# Patient Record
Sex: Male | Born: 1945
Health system: Southern US, Community
[De-identification: ages and names within clinical notes are randomized; demographics above are authoritative.]

## PROBLEM LIST (undated history)

## (undated) DIAGNOSIS — I10 Essential (primary) hypertension: Secondary | ICD-10-CM

## (undated) DIAGNOSIS — E785 Hyperlipidemia, unspecified: Secondary | ICD-10-CM

## (undated) HISTORY — DX: Hyperlipidemia, unspecified: E78.5

## (undated) HISTORY — DX: Essential (primary) hypertension: I10

## (undated) HISTORY — PX: COLONOSCOPY: SHX174

---

## 2016-05-22 DIAGNOSIS — J018 Other acute sinusitis: Secondary | ICD-10-CM | POA: Diagnosis not present

## 2016-06-22 DIAGNOSIS — I10 Essential (primary) hypertension: Secondary | ICD-10-CM | POA: Diagnosis not present

## 2016-06-22 DIAGNOSIS — E785 Hyperlipidemia, unspecified: Secondary | ICD-10-CM | POA: Diagnosis not present

## 2016-06-28 DIAGNOSIS — E875 Hyperkalemia: Secondary | ICD-10-CM | POA: Diagnosis not present

## 2017-03-12 DIAGNOSIS — Z23 Encounter for immunization: Secondary | ICD-10-CM | POA: Diagnosis not present

## 2017-03-15 DIAGNOSIS — E785 Hyperlipidemia, unspecified: Secondary | ICD-10-CM | POA: Diagnosis not present

## 2017-03-15 DIAGNOSIS — Z125 Encounter for screening for malignant neoplasm of prostate: Secondary | ICD-10-CM | POA: Diagnosis not present

## 2017-03-15 DIAGNOSIS — I10 Essential (primary) hypertension: Secondary | ICD-10-CM | POA: Diagnosis not present

## 2017-04-01 DIAGNOSIS — R011 Cardiac murmur, unspecified: Secondary | ICD-10-CM | POA: Diagnosis not present

## 2017-04-01 DIAGNOSIS — Z23 Encounter for immunization: Secondary | ICD-10-CM | POA: Diagnosis not present

## 2017-04-01 DIAGNOSIS — E785 Hyperlipidemia, unspecified: Secondary | ICD-10-CM | POA: Diagnosis not present

## 2017-04-01 DIAGNOSIS — I10 Essential (primary) hypertension: Secondary | ICD-10-CM | POA: Diagnosis not present

## 2017-04-01 DIAGNOSIS — J209 Acute bronchitis, unspecified: Secondary | ICD-10-CM | POA: Diagnosis not present

## 2017-04-18 DIAGNOSIS — R011 Cardiac murmur, unspecified: Secondary | ICD-10-CM | POA: Diagnosis not present

## 2017-04-29 DIAGNOSIS — R062 Wheezing: Secondary | ICD-10-CM | POA: Diagnosis not present

## 2017-04-29 DIAGNOSIS — J3089 Other allergic rhinitis: Secondary | ICD-10-CM | POA: Diagnosis not present

## 2017-04-29 DIAGNOSIS — R05 Cough: Secondary | ICD-10-CM | POA: Diagnosis not present

## 2017-05-19 DIAGNOSIS — R062 Wheezing: Secondary | ICD-10-CM | POA: Diagnosis not present

## 2017-05-19 DIAGNOSIS — J209 Acute bronchitis, unspecified: Secondary | ICD-10-CM | POA: Diagnosis not present

## 2017-05-19 DIAGNOSIS — R05 Cough: Secondary | ICD-10-CM | POA: Diagnosis not present

## 2017-08-09 DIAGNOSIS — R062 Wheezing: Secondary | ICD-10-CM | POA: Diagnosis not present

## 2017-10-07 DIAGNOSIS — M791 Myalgia, unspecified site: Secondary | ICD-10-CM | POA: Diagnosis not present

## 2017-10-07 DIAGNOSIS — J209 Acute bronchitis, unspecified: Secondary | ICD-10-CM | POA: Diagnosis not present

## 2017-10-08 DIAGNOSIS — R252 Cramp and spasm: Secondary | ICD-10-CM | POA: Diagnosis not present

## 2017-10-08 DIAGNOSIS — M791 Myalgia, unspecified site: Secondary | ICD-10-CM | POA: Diagnosis not present

## 2017-10-08 DIAGNOSIS — D539 Nutritional anemia, unspecified: Secondary | ICD-10-CM | POA: Diagnosis not present

## 2017-10-08 DIAGNOSIS — R05 Cough: Secondary | ICD-10-CM | POA: Diagnosis not present

## 2017-10-15 DIAGNOSIS — M791 Myalgia, unspecified site: Secondary | ICD-10-CM | POA: Diagnosis not present

## 2017-10-15 DIAGNOSIS — E785 Hyperlipidemia, unspecified: Secondary | ICD-10-CM | POA: Diagnosis not present

## 2017-10-15 DIAGNOSIS — E875 Hyperkalemia: Secondary | ICD-10-CM | POA: Diagnosis not present

## 2017-10-15 DIAGNOSIS — I10 Essential (primary) hypertension: Secondary | ICD-10-CM | POA: Diagnosis not present

## 2017-10-15 DIAGNOSIS — D539 Nutritional anemia, unspecified: Secondary | ICD-10-CM | POA: Diagnosis not present

## 2017-11-18 DIAGNOSIS — I10 Essential (primary) hypertension: Secondary | ICD-10-CM | POA: Diagnosis not present

## 2017-11-18 DIAGNOSIS — M791 Myalgia, unspecified site: Secondary | ICD-10-CM | POA: Diagnosis not present

## 2017-11-18 DIAGNOSIS — R739 Hyperglycemia, unspecified: Secondary | ICD-10-CM | POA: Diagnosis not present

## 2017-12-03 DIAGNOSIS — K621 Rectal polyp: Secondary | ICD-10-CM | POA: Diagnosis not present

## 2017-12-03 DIAGNOSIS — K635 Polyp of colon: Secondary | ICD-10-CM | POA: Diagnosis not present

## 2017-12-03 DIAGNOSIS — Z86010 Personal history of colonic polyps: Secondary | ICD-10-CM | POA: Diagnosis not present

## 2017-12-03 DIAGNOSIS — D123 Benign neoplasm of transverse colon: Secondary | ICD-10-CM | POA: Diagnosis not present

## 2017-12-03 DIAGNOSIS — D120 Benign neoplasm of cecum: Secondary | ICD-10-CM | POA: Diagnosis not present

## 2017-12-05 DIAGNOSIS — Z1159 Encounter for screening for other viral diseases: Secondary | ICD-10-CM | POA: Diagnosis not present

## 2017-12-05 DIAGNOSIS — Z125 Encounter for screening for malignant neoplasm of prostate: Secondary | ICD-10-CM | POA: Diagnosis not present

## 2017-12-05 DIAGNOSIS — Z0000 Encounter for general adult medical examination without abnormal findings: Secondary | ICD-10-CM | POA: Diagnosis not present

## 2017-12-23 DIAGNOSIS — L509 Urticaria, unspecified: Secondary | ICD-10-CM | POA: Diagnosis not present

## 2018-01-27 DIAGNOSIS — Z23 Encounter for immunization: Secondary | ICD-10-CM | POA: Diagnosis not present

## 2018-02-20 DIAGNOSIS — M353 Polymyalgia rheumatica: Secondary | ICD-10-CM | POA: Diagnosis not present

## 2018-02-20 DIAGNOSIS — E7849 Other hyperlipidemia: Secondary | ICD-10-CM | POA: Diagnosis not present

## 2018-02-20 DIAGNOSIS — Z1389 Encounter for screening for other disorder: Secondary | ICD-10-CM | POA: Diagnosis not present

## 2018-02-20 DIAGNOSIS — R634 Abnormal weight loss: Secondary | ICD-10-CM | POA: Diagnosis not present

## 2018-02-20 DIAGNOSIS — Z125 Encounter for screening for malignant neoplasm of prostate: Secondary | ICD-10-CM | POA: Diagnosis not present

## 2018-02-20 DIAGNOSIS — Z6825 Body mass index (BMI) 25.0-25.9, adult: Secondary | ICD-10-CM | POA: Diagnosis not present

## 2018-02-20 DIAGNOSIS — R82998 Other abnormal findings in urine: Secondary | ICD-10-CM | POA: Diagnosis not present

## 2018-02-20 DIAGNOSIS — F17201 Nicotine dependence, unspecified, in remission: Secondary | ICD-10-CM | POA: Diagnosis not present

## 2018-02-20 DIAGNOSIS — M6281 Muscle weakness (generalized): Secondary | ICD-10-CM | POA: Diagnosis not present

## 2018-02-20 DIAGNOSIS — R011 Cardiac murmur, unspecified: Secondary | ICD-10-CM | POA: Diagnosis not present

## 2018-02-20 DIAGNOSIS — I10 Essential (primary) hypertension: Secondary | ICD-10-CM | POA: Diagnosis not present

## 2018-02-20 DIAGNOSIS — D126 Benign neoplasm of colon, unspecified: Secondary | ICD-10-CM | POA: Diagnosis not present

## 2018-02-21 DIAGNOSIS — E8809 Other disorders of plasma-protein metabolism, not elsewhere classified: Secondary | ICD-10-CM | POA: Diagnosis not present

## 2018-02-24 ENCOUNTER — Other Ambulatory Visit: Payer: Self-pay | Admitting: Internal Medicine

## 2018-02-24 DIAGNOSIS — F17201 Nicotine dependence, unspecified, in remission: Secondary | ICD-10-CM

## 2018-02-26 ENCOUNTER — Inpatient Hospital Stay
Admission: RE | Admit: 2018-02-26 | Discharge: 2018-02-26 | Disposition: A | Discharge status: Home / Self Care | Payer: Self-pay | Source: Ambulatory Visit | Attending: Internal Medicine | Admitting: Internal Medicine

## 2018-03-03 ENCOUNTER — Ambulatory Visit
Admission: RE | Admit: 2018-03-03 | Discharge: 2018-03-03 | Disposition: A | Discharge status: Home / Self Care | Payer: Medicare Other | Source: Ambulatory Visit | Attending: Internal Medicine | Admitting: Internal Medicine

## 2018-03-03 ENCOUNTER — Ambulatory Visit: Payer: Self-pay

## 2018-03-03 DIAGNOSIS — F17201 Nicotine dependence, unspecified, in remission: Secondary | ICD-10-CM

## 2018-03-03 DIAGNOSIS — Z87891 Personal history of nicotine dependence: Secondary | ICD-10-CM | POA: Diagnosis not present

## 2018-03-06 ENCOUNTER — Other Ambulatory Visit: Payer: Self-pay | Admitting: Internal Medicine

## 2018-03-06 DIAGNOSIS — R911 Solitary pulmonary nodule: Secondary | ICD-10-CM

## 2018-03-12 DIAGNOSIS — Z6825 Body mass index (BMI) 25.0-25.9, adult: Secondary | ICD-10-CM | POA: Diagnosis not present

## 2018-03-12 DIAGNOSIS — M791 Myalgia, unspecified site: Secondary | ICD-10-CM | POA: Diagnosis not present

## 2018-03-12 DIAGNOSIS — E663 Overweight: Secondary | ICD-10-CM | POA: Diagnosis not present

## 2018-03-13 DIAGNOSIS — R634 Abnormal weight loss: Secondary | ICD-10-CM | POA: Diagnosis not present

## 2018-03-27 DIAGNOSIS — E7849 Other hyperlipidemia: Secondary | ICD-10-CM | POA: Diagnosis not present

## 2018-03-27 DIAGNOSIS — R918 Other nonspecific abnormal finding of lung field: Secondary | ICD-10-CM | POA: Diagnosis not present

## 2018-03-27 DIAGNOSIS — I77819 Aortic ectasia, unspecified site: Secondary | ICD-10-CM | POA: Diagnosis not present

## 2018-03-27 DIAGNOSIS — M791 Myalgia, unspecified site: Secondary | ICD-10-CM | POA: Diagnosis not present

## 2018-03-27 DIAGNOSIS — I350 Nonrheumatic aortic (valve) stenosis: Secondary | ICD-10-CM | POA: Diagnosis not present

## 2018-03-27 DIAGNOSIS — Z6825 Body mass index (BMI) 25.0-25.9, adult: Secondary | ICD-10-CM | POA: Diagnosis not present

## 2018-03-27 DIAGNOSIS — I2510 Atherosclerotic heart disease of native coronary artery without angina pectoris: Secondary | ICD-10-CM | POA: Diagnosis not present

## 2018-03-27 DIAGNOSIS — E291 Testicular hypofunction: Secondary | ICD-10-CM | POA: Diagnosis not present

## 2018-03-27 DIAGNOSIS — I10 Essential (primary) hypertension: Secondary | ICD-10-CM | POA: Diagnosis not present

## 2018-03-27 DIAGNOSIS — J449 Chronic obstructive pulmonary disease, unspecified: Secondary | ICD-10-CM | POA: Diagnosis not present

## 2018-04-11 ENCOUNTER — Encounter: Payer: Self-pay | Admitting: Internal Medicine

## 2018-04-11 ENCOUNTER — Ambulatory Visit (INDEPENDENT_AMBULATORY_CARE_PROVIDER_SITE_OTHER): Payer: Medicare Other | Admitting: Internal Medicine

## 2018-04-11 VITALS — BP 126/72 | HR 74 | Ht 68.0 in | Wt 163.2 lb

## 2018-04-11 DIAGNOSIS — E782 Mixed hyperlipidemia: Secondary | ICD-10-CM | POA: Diagnosis not present

## 2018-04-11 DIAGNOSIS — I350 Nonrheumatic aortic (valve) stenosis: Secondary | ICD-10-CM | POA: Diagnosis not present

## 2018-04-11 DIAGNOSIS — Z789 Other specified health status: Secondary | ICD-10-CM

## 2018-04-11 DIAGNOSIS — I2510 Atherosclerotic heart disease of native coronary artery without angina pectoris: Secondary | ICD-10-CM | POA: Diagnosis not present

## 2018-04-11 NOTE — Progress Notes (Addendum)
LIPID CLINIC CONSULT NOTE  Chief Complaint:  Evaluate cardiac risk  Primary Care Physician: Crist Infante, MD  HPI:  Aaron Miller is a 72 y.o. male who is being seen today for the evaluation of CAC and dyslipidemia at the request of Crist Infante, MD.  This is a pleasant 72 year old male who was kindly referred to me by Dr. Haynes Kerns for evaluation of dyslipidemia, statin intolerance and multivessel coronary artery calcification.  Mr.Chandran recently moved to [place] but have been living in [place], previously in [place] as well as in [place] for which she worked for the [employer] of [employer].  Initially went to school in [place] at the [employer].  He has a past medical history significant for hypertension and dyslipidemia as well as smoking.  He underwent a screening CT scan for possible lung malignancy and was found to have a subcentimeter nodule.  This will require follow-up in February.  In addition he was noted to have multivessel coronary artery calcification, as well as an ectatic and dilated ascending aorta to 4.2 cm.  Unfortunately he has been statin intolerant.  Most recently was on rosuvastatin and had significant myalgias.  He was taken off of this and has a marked dyslipidemia.  As of March 27, 2018 total cholesterol is 194, triglycerides 228, HDL 47 LDL 101.  He was started on Repatha per his primary care provider and has had one injection.  He was referred I presume for cardiovascular evaluation given his findings of multivessel coronary artery calcification and mild aortic stenosis.  Mr. Beckers reports he is quite physically active.  He says he is asymptomatic with exercise and activity.  He denies any chest pain or shortness of breath.  He said he did have stress testing in February this year in [place] that was ordered by his primary care provider which was apparently negative.  PMHx:  Past Medical History:  Diagnosis Date  .  Hyperlipidemia   . Hypertension     History reviewed. No pertinent surgical history.  FAMHx:  Family History  Problem Relation Age of Onset  . Cancer Mother     SOCHx:   reports that he has quit smoking. He has never used smokeless tobacco. His alcohol and drug histories are not on file.  ALLERGIES:  Allergies  Allergen Reactions  . Statins Other (See Comments)    Myalgias - Crestor, pravastatin    ROS: Pertinent items noted in HPI and remainder of comprehensive ROS otherwise negative.  HOME MEDS: Current Outpatient Medications on File Prior to Visit  Medication Sig Dispense Refill  . amLODipine (NORVASC) 10 MG tablet     . aspirin 81 MG tablet Take 81 mg by mouth daily.    . Evolocumab (REPATHA SURECLICK) 381 MG/ML SOAJ Inject 1 Dose into the skin every 14 (fourteen) days.     No current facility-administered medications on file prior to visit.     LABS/IMAGING: No results found for this or any previous visit (from the past 48 hour(s)). No results found.  LIPID PANEL: No results found for: CHOL, TRIG, HDL, CHOLHDL, VLDL, LDLCALC, LDLDIRECT  WEIGHTS: Wt Readings from Last 3 Encounters:  04/11/18 163 lb 3.2 oz (74 kg)    VITALS: BP 126/72   Pulse 74   Ht 5\' 8"  (1.727 m)   Wt 163 lb 3.2 oz (74 kg)   BMI 24.81 kg/m   EXAM: General appearance: alert and no distress Neck: no carotid bruit, no JVD and  thyroid not enlarged, symmetric, no tenderness/mass/nodules Lungs: clear to auscultation bilaterally Heart: regular rate and rhythm, S1, S2 normal and systolic murmur: early systolic 2/6, crescendo at 2nd right intercostal space Abdomen: soft, non-tender; bowel sounds normal; no masses,  no organomegaly Extremities: extremities normal, atraumatic, no cyanosis or edema Pulses: 2+ and symmetric Skin: Skin color, texture, turgor normal. No rashes or lesions Neurologic: Grossly normal Psych: Pleasant  EKG: Deferred  ASSESSMENT: 1. Marked  dyslipidemia 2. Statin intolerance 3. Multivessel coronary artery calcification 4. Dilated aorta 5. Mild aortic stenosis  PLAN: 1.   Mr. Mcneill has marked dyslipidemia with goal LDL cholesterol less than 70 given multivessel coronary artery calcification and mild aortic stenosis.  He is on been statin intolerant and had significant myalgias most recently on Crestor.  He has been switched to [place] by his primary care provider and has already had one injection.  He seems to be tolerating it.  We will have to reassess his lipids in a few months to see how well he is responding to this.  He may require additional therapy.  As he is asymptomatic and had stress testing earlier this year which was low risk, he would not need any further assessment of his multivessel calcification at this time.  He will need continued follow-up in cardiology for his calcification and mild aortic stenosis.  Thanks as always for the kind referral.  Pixie Casino, MD, FACC, [place] Director of the [facility]  [facility] Diplomate of the American Board of Clinical Lipidology Attending Cardiologist  Direct Dial: [phone]  Fax: [phone]  Website:  [email or web address] [email or web address] Akiera Allbaugh 04/11/2018, 1:07 PM

## 2018-04-11 NOTE — Patient Instructions (Signed)
Medication Instructions:  Continue current medications If you need a refill on your cardiac medications before your next appointment, please call your pharmacy.   Lab work: NONE If you have labs (blood work) drawn today and your tests are completely normal, you will receive your results only by: Marland Kitchen MyChart Message (if you have MyChart) OR . A paper copy in the mail If you have any lab test that is abnormal or we need to change your treatment, we will call you to review the results.  Testing/Procedures: NONE  Follow-Up: At [facility], you and your health needs are our priority.  As part of our continuing mission to provide you with exceptional heart care, we have created designated Provider Care Teams.  These Care Teams include your primary Cardiologist (physician) and Advanced Practice Providers (APPs -  Physician Assistants and Nurse Practitioners) who all work together to provide you with the care you need, when you need it. You will need a follow up appointment in 6 months.  Please call our office 2 months in advance to schedule this appointment.  You may see Dr. Debara Pickett or one of the following Advanced Practice Providers on your designated Care Team: Almyra Deforest, [place] . Fabian Sharp, PA-C  Any Other Special Instructions Will Be Listed Below (If Applicable).

## 2018-04-23 ENCOUNTER — Telehealth: Payer: Self-pay | Admitting: Internal Medicine

## 2018-04-23 NOTE — Telephone Encounter (Signed)
Received notice from [facility] that patient had been receiving ONLY samples of Repatha from Dr. Silvestre Mesi office. He has not gotten a prescription as both [place] were denied with submitted for PA by PCP.   Patient needs PA for medication & possible patient assistance.   LM for patient to return call - need prescription drug info in order to process prior authorization

## 2018-04-29 NOTE — Telephone Encounter (Addendum)
Spoke with patient about Repatha. He reports he has only been receiving samples from Dr. Joylene Draft. Explained that I will need to process a prior auth with insurance. He provided the info noted below:  ID: [id number] BIN: [id number] PCN: [id number] RxGrp: [id number]  Explained I will notify him of outcome of PA

## 2018-04-30 DIAGNOSIS — M791 Myalgia, unspecified site: Secondary | ICD-10-CM | POA: Diagnosis not present

## 2018-04-30 DIAGNOSIS — G6289 Other specified polyneuropathies: Secondary | ICD-10-CM | POA: Diagnosis not present

## 2018-04-30 DIAGNOSIS — Z6824 Body mass index (BMI) 24.0-24.9, adult: Secondary | ICD-10-CM | POA: Diagnosis not present

## 2018-04-30 NOTE — Telephone Encounter (Signed)
Appeals letter sent to MD to review.

## 2018-04-30 NOTE — Telephone Encounter (Signed)
Attempted PA for Repatha [employer] via [email or web address]. Copied below is notice received:  PA was already submitted for this patient and drug which was denied. CaseId: [id number] Status: Denied Appeal Information:  Attention: [place]  [id number], [place], [place], [address]  Phone: [phone]  Fax: [phone]

## 2018-05-02 DIAGNOSIS — Z6824 Body mass index (BMI) 24.0-24.9, adult: Secondary | ICD-10-CM | POA: Diagnosis not present

## 2018-05-02 DIAGNOSIS — M791 Myalgia, unspecified site: Secondary | ICD-10-CM | POA: Diagnosis not present

## 2018-05-05 ENCOUNTER — Ambulatory Visit (INDEPENDENT_AMBULATORY_CARE_PROVIDER_SITE_OTHER): Payer: Medicare Other | Admitting: Neurology

## 2018-05-05 ENCOUNTER — Encounter: Payer: Self-pay | Admitting: Neurology

## 2018-05-05 VITALS — BP 140/60 | HR 90 | Ht 68.0 in | Wt 161.0 lb

## 2018-05-05 DIAGNOSIS — R202 Paresthesia of skin: Secondary | ICD-10-CM | POA: Diagnosis not present

## 2018-05-05 DIAGNOSIS — M791 Myalgia, unspecified site: Secondary | ICD-10-CM | POA: Diagnosis not present

## 2018-05-05 DIAGNOSIS — I2510 Atherosclerotic heart disease of native coronary artery without angina pectoris: Secondary | ICD-10-CM

## 2018-05-05 DIAGNOSIS — R292 Abnormal reflex: Secondary | ICD-10-CM | POA: Diagnosis not present

## 2018-05-05 NOTE — Progress Notes (Signed)
[facility] Clinic Note - Initial Visit   Date: 05/05/18  Aaron Miller MRN: [id number] DOB: 09/18/1945   Dear Dr. Joylene Draft:  Thank you for your kind referral of Aaron Miller for consultation of myalgias. Although his history is well known to you, please allow [place] to reiterate it for the purpose of our medical record. The patient was accompanied to the clinic by self.   History of Present Illness: Aaron Miller is a 72 y.o. right-handed Caucasian male with hypertension and hyperlipidemia presenting for evaluation of myalgias.  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    Starting around August, he began having soreness over the shins, thighs, and shoulder region.  Soreness is described as an achy pain and tenderness over the muscles.  He wakes up around 2-3am with throbbing and burning pain in the legs which is alleviated with walking.  He gets cramps in the legs, denies low back pain. Pain is relieved with NSAIDs/tylenol. He does not have numbness. Initially, there was concern with he has statin-induced myalgias and was taken off Crestor which may have helped some.  He has been off this for 3 months.  He does not have weakness of the arms/legs, denies problems with walking/climbing stairs. No falls.  CK is normal.  He was evaluated by rheumatology for these same symptoms whose evaluation was not typical for PMR and would consider trial of steroids, if his neurological evaluation returned negative.   Out-side paper records, electronic medical record, and images have been reviewed where available and summarized as:  Labs 02/20/2018: ESR 15, vitamin D 30, TSH 1.32, CK 34, CRP 31*, SPEP with IFE polyclonal gammopathy, hepatitis panel neg  Past Medical History:  Diagnosis Date  . Hyperlipidemia   . Hypertension     History reviewed. No pertinent surgical  history.   Medications:  Outpatient Encounter Medications as of 05/05/2018  Medication Sig  . amLODipine (NORVASC) 10 MG tablet   . aspirin 81 MG tablet Take 81 mg by mouth daily.  . Evolocumab (REPATHA SURECLICK) 103 MG/ML SOAJ Inject 1 Dose into the skin every 14 (fourteen) days.   No facility-administered encounter medications on file as of 05/05/2018.      Allergies:  Allergies  Allergen Reactions  . Statins Other (See Comments)    Myalgias - Crestor, pravastatin    Family History: Family History  Problem Relation Age of Onset  . Lung cancer Mother   . Hypertension Father   . Diabetes Father   . Lung cancer Maternal Grandmother     Social History: Social History   Tobacco Use  . Smoking status: Former Research scientist (life sciences)  . Smokeless tobacco: Never Used  Substance Use Topics  . Alcohol use: Not on file    Comment: 2 glasses of wine daily  . Drug use: Never   Social History   Social History Narrative   Married.  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  Masters degree    Review of Systems:  CONSTITUTIONAL: No fevers, chills, night sweats, or weight loss.   EYES: No visual changes or eye pain ENT: No hearing changes.  No history of nose bleeds.   RESPIRATORY: No cough, wheezing and shortness of breath.   CARDIOVASCULAR: Negative for chest pain, and palpitations.   GI: Negative for abdominal discomfort, blood in stools or black stools.  No recent change in bowel habits.   GU:  No history of incontinence.   MUSCLOSKELETAL: +history of joint pain or swelling.  +myalgias.   SKIN: Negative for lesions, rash, and itching.   HEMATOLOGY/ONCOLOGY: Negative for prolonged bleeding, bruising easily, and swollen nodes.  No history of cancer.   ENDOCRINE: Negative for cold or heat intolerance, polydipsia or goiter.   PSYCH:  No depression or anxiety symptoms.   NEURO: As Above.   Vital Signs:  BP 140/60   Pulse 90   Ht '5\' 8"'  (1.727 m)   Wt 161  lb (73 kg)   SpO2 96%   BMI 24.48 kg/m    General Medical Exam:   General:  Well appearing, comfortable.   Eyes/ENT: see cranial nerve examination.   Neck: No masses appreciated.  Full range of motion without tenderness.  No carotid bruits. Respiratory:  Clear to auscultation, good air entry bilaterally.   Cardiac:  Regular rate and rhythm, no murmur.   Extremities:  No deformities, edema, or skin discoloration.  Skin:  Dry leathery skin over the lower legs bilaterally.   Neurological Exam: MENTAL STATUS including orientation to time, place, person, recent and remote memory, attention span and concentration, language, and fund of knowledge is normal.  Speech is not dysarthric.  CRANIAL NERVES: II:  No visual field defects.  Unremarkable fundi.   III-IV-VI: Pupils equal round and reactive to light.  Normal conjugate, extra-ocular eye movements in all directions of gaze.  No nystagmus.  No ptosis.   V:  Normal facial sensation.    VII:  Normal facial symmetry and movements.   VIII:  Normal hearing and vestibular function.   IX-X:  Normal palatal movement.   XI:  Normal shoulder shrug and head rotation.   XII:  Normal tongue strength and range of motion, no deviation or fasciculation.  MOTOR:  No atrophy, fasciculations or abnormal movements.  No pronator drift.  Tone is normal.    Right Upper Extremity:    Left Upper Extremity:    Deltoid  5/5   Deltoid  5/5   Biceps  5/5   Biceps  5/5   Triceps  5/5   Triceps  5/5   Wrist extensors  5/5   Wrist extensors  5/5   Wrist flexors  5/5   Wrist flexors  5/5   Finger extensors  5/5   Finger extensors  5/5   Finger flexors  5/5   Finger flexors  5/5   Dorsal interossei  5/5   Dorsal interossei  5/5   Abductor pollicis  5/5   Abductor pollicis  5/5   Tone (Ashworth scale)  0  Tone (Ashworth scale)  0   Right Lower Extremity:    Left Lower Extremity:    Hip flexors  5/5   Hip flexors  5/5   Hip extensors  5/5   Hip extensors  5/5    Knee flexors  5/5   Knee flexors  5/5   Knee extensors  5/5   Knee extensors  5/5   Dorsiflexors  5/5   Dorsiflexors  5/5   Plantarflexors  5/5   Plantarflexors  5/5   Toe extensors  5/5   Toe extensors  5/5   Toe flexors  5/5   Toe flexors  5/5   Tone (Ashworth scale)  0  Tone (Ashworth scale)  0   MSRs:  Right  Left brachioradialis 2+  brachioradialis 2+  biceps 2+  biceps 2+  triceps 2+  triceps 2+  patellar 3+  patellar 3+  ankle jerk 1+  ankle jerk 1+  Hoffman no  Hoffman no  plantar response up  plantar response up   SENSORY:  Normal and symmetric perception of light touch, pinprick, vibration, and proprioception.  Romberg's sign absent.   COORDINATION/GAIT: Normal finger-to- nose-finger and heel-to-shin.  Intact rapid alternating movements bilaterally.  Able to rise from a chair without using arms.  Gait narrow based and stable. Tandem and stressed gait intact.    IMPRESSION: 1.  Diffuse polymyalgias of the arms and legs.  His neurological exam does not disclose weakness and with normal CK, myopathy is less likely.Statin-induced myalgias is possible, but I would expect symptoms to improve now that he has been off this for 3 months.  He will undergo NCS/EMG of the right arm and leg which will look for neuropathy and myopathy.   2.  Possible lumbar canal stenosis given exam showing brisk patella jerks and extensor plantar responses.  He does not have low back pain or symptoms of neurogenic claudication.  I will order MRI lumbar spine to evaluate for structural pathology.  Further recommendations pending results   Thank you for allowing me to participate in patient's care.  If I can answer any additional questions, I would be pleased to do so.    Sincerely,     K. Posey Pronto, DO

## 2018-05-05 NOTE — Patient Instructions (Addendum)
NCS/EMG of the right arm and leg  MRI lumbar spine without contrast   ELECTROMYOGRAM AND NERVE CONDUCTION STUDIES (EMG/NCS) INSTRUCTIONS  How to Prepare The neurologist conducting the EMG will need to know if you have certain medical conditions. Tell the neurologist and other EMG lab personnel if you: . Have a pacemaker or any other electrical medical device . Take blood-thinning medications . Have hemophilia, a blood-clotting disorder that causes prolonged bleeding Bathing Take a shower or bath shortly before your exam in order to remove oils from your skin. Don't apply lotions or creams before the exam.  What to Expect You'll likely be asked to change into a hospital gown for the procedure and lie down on an examination table. The following explanations can help you understand what will happen during the exam.  . Electrodes. The neurologist or a technician places surface electrodes at various locations on your skin depending on where you're experiencing symptoms. Or the neurologist may insert needle electrodes at different sites depending on your symptoms.  . Sensations. The electrodes will at times transmit a tiny electrical current that you may feel as a twinge or spasm. The needle electrode may cause discomfort or pain that usually ends shortly after the needle is removed. If you are concerned about discomfort or pain, you may want to talk to the neurologist about taking a short break during the exam.  . Instructions. During the needle EMG, the neurologist will assess whether there is any spontaneous electrical activity when the muscle is at rest - activity that isn't present in healthy muscle tissue - and the degree of activity when you slightly contract the muscle.  He or she will give you instructions on resting and contracting a muscle at appropriate times. Depending on what muscles and nerves the neurologist is examining, he or she may ask you to change positions during the exam.   After your EMG You may experience some temporary, minor bruising where the needle electrode was inserted into your muscle. This bruising should fade within several days. If it persists, contact your primary care doctor.

## 2018-05-09 ENCOUNTER — Telehealth: Payer: Self-pay | Admitting: Internal Medicine

## 2018-05-09 NOTE — Telephone Encounter (Signed)
Letter + office note + labs has been faxed.

## 2018-05-09 NOTE — Telephone Encounter (Signed)
Follow up:    Patient calling concerns a appeal for insurance for some medication.

## 2018-05-09 NOTE — Telephone Encounter (Signed)
MD has composed appeals letter. This will be faxed today with MD office note & labs  LM for patient with this info.

## 2018-05-09 NOTE — Telephone Encounter (Signed)
Routed you the edited letter.  Dr. Lemmie Evens

## 2018-05-19 NOTE — Telephone Encounter (Signed)
Spoke with [facility] [employer] and was notified patient has been approved for Repatha from May 21, 2017 - May 13, 2019. They will re-fax the approval notice.

## 2018-05-19 NOTE — Telephone Encounter (Signed)
LMTCB to discuss Repatha approval

## 2018-05-20 NOTE — Telephone Encounter (Signed)
LMTCB to discuss Repatha approval

## 2018-05-26 DIAGNOSIS — I2510 Atherosclerotic heart disease of native coronary artery without angina pectoris: Secondary | ICD-10-CM | POA: Diagnosis not present

## 2018-05-26 DIAGNOSIS — I10 Essential (primary) hypertension: Secondary | ICD-10-CM | POA: Diagnosis not present

## 2018-05-29 ENCOUNTER — Ambulatory Visit (INDEPENDENT_AMBULATORY_CARE_PROVIDER_SITE_OTHER): Payer: Medicare Other | Admitting: Neurology

## 2018-05-29 DIAGNOSIS — M5416 Radiculopathy, lumbar region: Secondary | ICD-10-CM

## 2018-05-29 DIAGNOSIS — M791 Myalgia, unspecified site: Secondary | ICD-10-CM | POA: Diagnosis not present

## 2018-05-29 DIAGNOSIS — R202 Paresthesia of skin: Secondary | ICD-10-CM

## 2018-05-29 NOTE — Procedures (Signed)
[facility]  [place]  [place], [place] [address] Tel: [phone] Fax:  [phone] Test Date:  05/29/2018  Patient: Aaron Miller DOB: 1946-04-02 Physician: Narda Amber, DO  Sex: Male Height: 5\' 8"  Ref Phys: Narda Amber, DO  ID#: [id number] Temp: 35.0C Technician:    Patient Complaints: This is a 73 year old man referred for evaluation of generalized myalgias and polyarthralgias.  NCV & EMG Findings: Extensive electrodiagnostic testing of the right lower extremity and additional studies of the left shows:  1. Bilateral sural and superficial peroneal sensory responses are within normal limits. 2. Bilateral peroneal motor responses are reduced at the extensor digitorum brevis, and normal at the tibialis anterior.  Bilateral tibial motor responses are within normal limits. 3. Bilateral tibial H reflex studies are mildly prolonged. 4. Chronic motor axonal loss changes are seen affecting bilateral L4 myotomes, without accompanied active denervation.  Impression: 1. Chronic L4 radiculopathy affecting bilateral lower extremities, mild-to-moderate in degree electrically. 2. There is no evidence of a diffuse myopathy or sensorimotor polyneuropathy.   ___________________________ Narda Amber, DO    Nerve Conduction Studies Anti Sensory Summary Table   Site NR Peak (ms) Norm Peak (ms) P-T Amp (V) Norm P-T Amp  Left Sup Peroneal Anti Sensory (Ant Lat Mall)  35C  12 cm    3.6 <4.6 5.0 >3  Right Sup Peroneal Anti Sensory (Ant Lat Mall)  35C  12 cm    2.8 <4.6 3.9 >3  Left Sural Anti Sensory (Lat Mall)  35C  Calf    3.6 <4.6 5.7 >3  Right Sural Anti Sensory (Lat Mall)  35C  Calf    2.1 <4.6 6.9 >3   Motor Summary Table   Site NR Onset (ms) Norm Onset (ms) O-P Amp (mV) Norm O-P Amp Site1 Site2 Delta-0 (ms) Dist (cm) Vel (m/s) Norm Vel (m/s)  Left Peroneal Motor (Ext Dig Brev)  35C  Ankle    3.2 <6.0 0.5 >2.5 B Fib Ankle 10.2 35.0 34 >40  B Fib     13.4  0.5  Poplt B Fib 1.5 7.0 47 >40  Poplt    14.9  0.6         Right Peroneal Motor (Ext Dig Brev)  35C  Ankle    3.6 <6.0 1.7 >2.5 B Fib Ankle 8.5 38.0 45 >40  B Fib    12.1  1.7  Poplt B Fib 1.8 8.0 44 >40  Poplt    13.9  1.7         Left Peroneal TA Motor (Tib Ant)  35C  Fib Head    3.3 <4.5 4.1 >3 Poplit Fib Head 1.1 7.0 64 >40  Poplit    4.4  3.8         Right Peroneal TA Motor (Tib Ant)  35C  Fib Head    3.1 <4.5 4.1 >3 Poplit Fib Head 1.5 8.0 53 >40  Poplit    4.6  4.0         Left Tibial Motor (Abd Hall Brev)  35C  Ankle    4.4 <6.0 4.3 >4 Knee Ankle 10.0 42.0 42 >40  Knee    14.4  2.8         Right Tibial Motor (Abd Hall Brev)  35C  Ankle    4.1 <6.0 6.2 >4 Knee Ankle 9.6 40.0 42 >40  Knee    13.7  3.1          H Reflex Studies  NR H-Lat (ms) Lat Norm (ms) L-R H-Lat (ms)  Left Tibial (Gastroc)  35C     38.23 <35 1.09  Right Tibial (Gastroc)  35C     37.14 <35 1.09   EMG   Side Muscle Ins Act Fibs Psw Fasc Number Recrt Dur Dur. Amp Amp. Poly Poly. Comment  Left RectFemoris Nml Nml Nml Nml 1- Rapid Some 1+ Some 1+ Some 1+ N/A  Left AntTibialis Nml Nml Nml Nml 1- Rapid Few 1+ Few 1+ Nml Nml N/A  Left Gastroc Nml Nml Nml Nml Nml Nml Nml Nml Nml Nml Nml Nml N/A  Left Flex Dig Long Nml Nml Nml Nml Nml Nml Nml Nml Nml Nml Nml Nml N/A  Left BicepsFemS Nml Nml Nml Nml Nml Nml Nml Nml Nml Nml Nml Nml N/A  Left GluteusMed Nml Nml Nml Nml Nml Nml Nml Nml Nml Nml Nml Nml N/A  Right AntTibialis Nml Nml Nml Nml 1- Rapid Some 1+ Some 1+ Nml Nml N/A  Right RectFemoris Nml Nml Nml Nml 1- Rapid Many 1+ Many 1+ Nml Nml N/A  Right BicepsFemS Nml Nml Nml Nml Nml Nml Nml Nml Nml Nml Nml Nml N/A  Right Flex Dig Long Nml Nml Nml Nml Nml Nml Nml Nml Nml Nml Nml Nml N/A  Right GluteusMed Nml Nml Nml Nml Nml Nml Nml Nml Nml Nml Nml Nml N/A  Right Gastroc Nml Nml Nml Nml Nml Nml Nml Nml Nml Nml Nml Nml N/A      Waveforms:

## 2018-06-02 ENCOUNTER — Telehealth: Payer: Self-pay | Admitting: *Deleted

## 2018-06-02 NOTE — Telephone Encounter (Signed)
Called and gave patient results.

## 2018-06-02 NOTE — Telephone Encounter (Signed)
-----   Message from Alda Berthold, DO sent at 05/30/2018  5:57 PM EST ----- Please inform patient that his nerve testing does not show evidence of muscle disease or neuropathy.  There is some evidence of nerve impingement stemming from his back.  MRI lumbar spine is scheduled for February.  Once these results are available, we will be in touch with him.

## 2018-06-05 MED ORDER — EVOLOCUMAB 140 MG/ML ~~LOC~~ SOAJ: 1.0000 | SUBCUTANEOUS | 11 refills | Status: DC

## 2018-06-05 NOTE — Addendum Note (Signed)
Addended by: Fidel Levy on: 06/05/2018 09:35 AM   Modules accepted: Orders

## 2018-06-05 NOTE — Telephone Encounter (Signed)
Patient has not returned call to date. Rx sent to [facility] for Repatha

## 2018-06-07 ENCOUNTER — Ambulatory Visit
Admission: RE | Admit: 2018-06-07 | Discharge: 2018-06-07 | Disposition: A | Discharge status: Home / Self Care | Payer: Medicare Other | Source: Ambulatory Visit | Attending: Neurology | Admitting: Neurology

## 2018-06-07 DIAGNOSIS — M5127 Other intervertebral disc displacement, lumbosacral region: Secondary | ICD-10-CM | POA: Diagnosis not present

## 2018-06-07 DIAGNOSIS — R202 Paresthesia of skin: Secondary | ICD-10-CM

## 2018-06-07 DIAGNOSIS — R292 Abnormal reflex: Secondary | ICD-10-CM

## 2018-06-07 DIAGNOSIS — M4807 Spinal stenosis, lumbosacral region: Secondary | ICD-10-CM | POA: Diagnosis not present

## 2018-06-07 DIAGNOSIS — M791 Myalgia, unspecified site: Secondary | ICD-10-CM

## 2018-06-07 DIAGNOSIS — M48061 Spinal stenosis, lumbar region without neurogenic claudication: Secondary | ICD-10-CM | POA: Diagnosis not present

## 2018-06-07 DIAGNOSIS — M5126 Other intervertebral disc displacement, lumbar region: Secondary | ICD-10-CM | POA: Diagnosis not present

## 2018-06-10 ENCOUNTER — Telehealth: Payer: Self-pay | Admitting: *Deleted

## 2018-06-10 NOTE — Telephone Encounter (Signed)
-----   Message from Alda Berthold, DO sent at 06/09/2018 11:54 AM EST ----- Please inform patient that his MRI lumbar spine shows very mild age-related changes and narrowing, but nothing which would explain the severity of his leg pain.  He can follow-up in the office on Wednesday 10:30a or Friday morning at 8:30am to review results and decide the next step.

## 2018-06-10 NOTE — Telephone Encounter (Signed)
I spoke with patient and gave him the results.  He will come in on 06/13/2018 to see Dr. Posey Pronto.

## 2018-06-13 ENCOUNTER — Telehealth: Payer: Self-pay | Admitting: *Deleted

## 2018-06-13 ENCOUNTER — Encounter: Payer: Self-pay | Admitting: Neurology

## 2018-06-13 ENCOUNTER — Other Ambulatory Visit (INDEPENDENT_AMBULATORY_CARE_PROVIDER_SITE_OTHER): Payer: Medicare Other

## 2018-06-13 ENCOUNTER — Ambulatory Visit (INDEPENDENT_AMBULATORY_CARE_PROVIDER_SITE_OTHER): Payer: Medicare Other | Admitting: Neurology

## 2018-06-13 ENCOUNTER — Other Ambulatory Visit: Payer: Self-pay | Admitting: *Deleted

## 2018-06-13 ENCOUNTER — Ambulatory Visit: Payer: Medicare Other | Admitting: Neurology

## 2018-06-13 VITALS — BP 120/74 | HR 79 | Ht 68.0 in | Wt 159.4 lb

## 2018-06-13 DIAGNOSIS — L299 Pruritus, unspecified: Secondary | ICD-10-CM

## 2018-06-13 DIAGNOSIS — D649 Anemia, unspecified: Secondary | ICD-10-CM | POA: Diagnosis not present

## 2018-06-13 DIAGNOSIS — R6889 Other general symptoms and signs: Secondary | ICD-10-CM

## 2018-06-13 DIAGNOSIS — T148XXA Other injury of unspecified body region, initial encounter: Secondary | ICD-10-CM | POA: Diagnosis not present

## 2018-06-13 DIAGNOSIS — G2581 Restless legs syndrome: Secondary | ICD-10-CM

## 2018-06-13 DIAGNOSIS — R5383 Other fatigue: Secondary | ICD-10-CM

## 2018-06-13 LAB — FERRITIN: Ferritin: 154.9 ng/mL (ref 22.0–322.0)

## 2018-06-13 MED ORDER — ROPINIROLE HCL 0.25 MG PO TABS: ORAL_TABLET | ORAL | 3 refills | Status: DC

## 2018-06-13 NOTE — Progress Notes (Signed)
Follow-up Visit   Date: 06/13/18    Aaron Miller MRN: [id number] DOB: June 14, 1945   Interim History: Aaron Miller is a 73 y.o. right-handed Caucasian male with hypertension and hyperlipidemia returning to the clinic for follow-up of myalgias.  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    History of present illness: Starting around August, he began having soreness over the shins, thighs, and shoulder region.  Soreness is described as an achy pain and tenderness over the muscles.  He wakes up around 2-3am with throbbing and burning pain in the legs which is alleviated with walking.  He gets cramps in the legs, denies low back pain. Pain is relieved with NSAIDs/tylenol. He does not have numbness. Initially, there was concern with he has statin-induced myalgias and was taken off Crestor which may have helped some.  He has been off this for 3 months.  He does not have weakness of the arms/legs, denies problems with walking/climbing stairs. No falls.  CK is normal.  He was evaluated by rheumatology for these same symptoms whose evaluation was not typical for PMR and would consider trial of steroids, if his neurological evaluation returned negative.   UPDATE 06/13/2018:  He is here to review the results of his EMG and MRI lumbar spine.  There was no evidence of structural pathology on his MRI to explain his pain, no signs of myopathy or neuropathy on his EMG.  There was a mild L4 radiculopathy bilaterally, however imaging did not show severe enough nerve encroachment to manifest with the degree of pain he is reporting. Over the past month, his leg soreness has markedly improved and he no longer has throbbing pain or cramps.  However, he continues to have intermittent spells of shooting pain in the legs, which is always at rest.  He often wakes up at night and walks to get relief.  Symptoms can be bothersome  when he is sitting for long periods of time. He denies the urge to move the legs.  Over the past month, he has developed itching over the lower legs and chest.  There is some breakdown of the skin because of severe itching.  He has not talked to his PCP about this.  Medications:  Current Outpatient Medications on File Prior to Visit  Medication Sig Dispense Refill  . amLODipine (NORVASC) 10 MG tablet     . aspirin 81 MG tablet Take 81 mg by mouth daily.    . Evolocumab (REPATHA SURECLICK) 891 MG/ML SOAJ Inject 1 Dose into the skin every 14 (fourteen) days. 2 pen 11   No current facility-administered medications on file prior to visit.     Allergies:  Allergies  Allergen Reactions  . Statins Other (See Comments)    Myalgias - Crestor, pravastatin    Review of Systems:  CONSTITUTIONAL: No fevers, chills, night sweats, or weight loss.  EYES: No visual changes or eye pain ENT: No hearing changes.  No history of nose bleeds.   RESPIRATORY: No cough, wheezing and shortness of breath.   CARDIOVASCULAR: Negative for chest pain, and palpitations.   GI: Negative for abdominal discomfort, blood in stools or black stools.  No recent change in bowel habits.   GU:  No history of incontinence.   MUSCLOSKELETAL: No history of joint pain or swelling.  No myalgias.   SKIN: Negative for lesions, +rash, +itching.   ENDOCRINE:  Negative for cold or heat intolerance, polydipsia or goiter.   PSYCH:  No depression or anxiety symptoms.   NEURO: As Above.   Vital Signs:  BP 120/74   Pulse 79   Ht '5\' 8"'$  (1.727 m)   Wt 159 lb 6 oz (72.3 kg)   SpO2 98%   BMI 24.23 kg/m    General Medical Exam:   General:  Well appearing, comfortable  Eyes/ENT: see cranial nerve examination.   Neck: Full range of motion without tenderness.  No carotid bruits. Respiratory:  Clear to auscultation, good air entry bilaterally.   Cardiac:  Regular rate and rhythm, no murmur.   Ext:  No edema Skin:  Thick skin over  the lower legs, multiple excoriations from scratching the legs and feet, small erythematous rash over the feet  Neurological Exam: MENTAL STATUS including orientation to time, place, person, recent and remote memory, attention span and concentration, language, and fund of knowledge is normal.  Speech is not dysarthric.  CRANIAL NERVES:  Pupils equal round and reactive to light.  Normal conjugate, extra-ocular eye movements in all directions of gaze.  No ptosis.  Face is symmetric. Palate elevates symmetrically.  Tongue is midline.  MOTOR:  Motor strength is 5/5 in all extremities.  No atrophy, fasciculations or abnormal movements.  No pronator drift.  Tone is normal.    MSRs:  Right                                                                 Left brachioradialis 2+  brachioradialis 2+  biceps 2+  biceps 2+  triceps 2+  triceps 2+  patellar 3+  patellar 3+  ankle jerk 1+  ankle jerk 1+  Hoffman no  Hoffman no  plantar response up  plantar response up   SENSORY:  Intact to temperature and vibration throughout.  COORDINATION/GAIT:  Gait narrow based and stable.   Data: MRI lumbar spine 06/07/2018: Mild disc and facet degeneration throughout the lumbar spine. Mild stenosis as above.  NCS/EMG of the legs 05/29/2018:  Chronic L4 radiculopathy affecting bilateral lower extremities, mild-to-moderate in degree electrically.  There is no evidence of a diffuse myopathy or sensorimotor polyneuropathy.  Labs 02/20/2018: ESR 15, vitamin D 30, TSH 1.32, CK 34, CRP 31*, SPEP with IFE polyclonal gammopathy, hepatitis panel neg   IMPRESSION/PLAN: 1.  Restless leg syndrome manifesting with rest pain and paresthesias waking him up from sleeping and improve with walking.  - Check ferritin level  - Start ropinirole 0.'25mg'$  3 hours before bedtime.  Side effects including black box warning discussed  - No evidence of neuropathy, polyradiculoneuropathy, or myopathy causing his symptoms.   - MRI lumbar  spine does not show severe nerve impingement.    2.  Brisk patella reflex with extensor response, no signs of myelopathy on his lumbar imaging.  He does not complain of neck pain and reflexes are normal in the arms.  Low threshold to imaging his cervical spine, if he becomes symptomatic.   3.  Diffuse myalgias of the legs - resolved.  I suspect that now that symptoms have improved, this may have been delayed effects of statin therapy.  4.  Skin excoriation/rash/itching.  Follow-up with PCP and/or dermatology  Return to clinic in 3 months  Thank you for allowing me to participate in patient's care.  If I can answer any additional questions, I would be pleased to do so.    Sincerely,    Adreyan Carbajal K. Posey Pronto, DO

## 2018-06-13 NOTE — Telephone Encounter (Signed)
-----   Message from Alda Berthold, DO sent at 06/13/2018 11:14 AM EST ----- Please notify patient lab are within normal limits.  Thank you.

## 2018-06-13 NOTE — Patient Instructions (Addendum)
Check ferritin  Start ropinirole 0.25mg  3 hours before bedtime.  Please follow-up with Dr. Joylene Draft about your skin rash and itching  Return to clinic in 3 months

## 2018-06-13 NOTE — Telephone Encounter (Signed)
Left message with wife that labs are normal.

## 2018-06-16 ENCOUNTER — Telehealth: Payer: Self-pay | Admitting: *Deleted

## 2018-06-16 DIAGNOSIS — L03119 Cellulitis of unspecified part of limb: Secondary | ICD-10-CM | POA: Diagnosis not present

## 2018-06-16 DIAGNOSIS — E7849 Other hyperlipidemia: Secondary | ICD-10-CM | POA: Diagnosis not present

## 2018-06-16 DIAGNOSIS — I8310 Varicose veins of unspecified lower extremity with inflammation: Secondary | ICD-10-CM | POA: Diagnosis not present

## 2018-06-16 DIAGNOSIS — I10 Essential (primary) hypertension: Secondary | ICD-10-CM | POA: Diagnosis not present

## 2018-06-16 DIAGNOSIS — Z6824 Body mass index (BMI) 24.0-24.9, adult: Secondary | ICD-10-CM | POA: Diagnosis not present

## 2018-06-16 DIAGNOSIS — I2510 Atherosclerotic heart disease of native coronary artery without angina pectoris: Secondary | ICD-10-CM | POA: Diagnosis not present

## 2018-06-16 NOTE — Telephone Encounter (Signed)
Called patient and gave results.

## 2018-06-18 ENCOUNTER — Telehealth: Payer: Self-pay | Admitting: Internal Medicine

## 2018-06-18 ENCOUNTER — Ambulatory Visit: Payer: Medicare Other | Admitting: Adult Health

## 2018-06-18 DIAGNOSIS — R55 Syncope and collapse: Secondary | ICD-10-CM | POA: Diagnosis not present

## 2018-06-18 DIAGNOSIS — E8809 Other disorders of plasma-protein metabolism, not elsewhere classified: Secondary | ICD-10-CM | POA: Diagnosis not present

## 2018-06-18 DIAGNOSIS — Z6824 Body mass index (BMI) 24.0-24.9, adult: Secondary | ICD-10-CM | POA: Diagnosis not present

## 2018-06-18 DIAGNOSIS — G629 Polyneuropathy, unspecified: Secondary | ICD-10-CM | POA: Diagnosis not present

## 2018-06-18 DIAGNOSIS — I8310 Varicose veins of unspecified lower extremity with inflammation: Secondary | ICD-10-CM | POA: Diagnosis not present

## 2018-06-18 DIAGNOSIS — R634 Abnormal weight loss: Secondary | ICD-10-CM | POA: Diagnosis not present

## 2018-06-18 DIAGNOSIS — I2510 Atherosclerotic heart disease of native coronary artery without angina pectoris: Secondary | ICD-10-CM | POA: Diagnosis not present

## 2018-06-18 NOTE — Progress Notes (Deleted)
Cardiology Office Note   Date:  06/18/2018   ID:  Jabri, Blancett 1946/04/24, MRN [id number]  PCP:  Crist Infante, MD  Cardiologist:  Debara Pickett No chief complaint on file.    History of Present Illness: Aaron Miller is a 73 y.o. male who presents for complaints of syncope, with known history dyslipidemia, mild aortic stenosis, on Repatha,     Past Medical History:  Diagnosis Date  . Hyperlipidemia   . Hypertension     No past surgical history on file.   Current Outpatient Medications  Medication Sig Dispense Refill  . amLODipine (NORVASC) 10 MG tablet     . aspirin 81 MG tablet Take 81 mg by mouth daily.    . Evolocumab (REPATHA SURECLICK) 675 MG/ML SOAJ Inject 1 Dose into the skin every 14 (fourteen) days. 2 pen 11  . rOPINIRole (REQUIP) 0.25 MG tablet Take 1 tablet three hours at bedtime. 30 tablet 3   No current facility-administered medications for this visit.     Allergies:   Statins    Social History:  The patient  reports that he has quit smoking. He has never used smokeless tobacco. He reports that he does not use drugs.   Family History:  The patient's family history includes Diabetes in his father; Hypertension in his father; Lung cancer in his maternal grandmother and mother.    ROS: All other systems are reviewed and negative. Unless otherwise mentioned in H&P    PHYSICAL EXAM: VS:  There were no vitals taken for this visit. , BMI There is no height or weight on file to calculate BMI. GEN: Well nourished, well developed, in no acute distress HEENT: normal Neck: no JVD, carotid bruits, or masses Cardiac: ***RRR; no murmurs, rubs, or gallops,no edema  Respiratory:  Clear to auscultation bilaterally, normal work of breathing GI: soft, nontender, nondistended, + BS MS: no deformity or atrophy Skin: warm and dry, no rash Neuro:  Strength and sensation are intact Psych: euthymic mood, full affect   EKG:  EKG {ACTION; IS/IS [id number]} ordered  today. The ekg ordered today demonstrates ***   Recent Labs: No results found for requested labs within last 8760 hours.    Lipid Panel No results found for: CHOL, TRIG, HDL, CHOLHDL, VLDL, LDLCALC, LDLDIRECT    Wt Readings from Last 3 Encounters:  06/13/18 159 lb 6 oz (72.3 kg)  05/05/18 161 lb (73 kg)  04/11/18 163 lb 3.2 oz (74 kg)      Other studies Reviewed: Additional studies/ records that were reviewed today include: ***. Review of the above records demonstrates: ***   ASSESSMENT AND PLAN:  1.  ***   Current medicines are reviewed at length with the patient today.    Labs/ tests ordered today include: *** Phill Myron. [place] Pugh, ANP, AACC   06/18/2018 1:39 PM    [facility] [address] Office [phone] Fax [phone]

## 2018-06-18 NOTE — Telephone Encounter (Signed)
Ok thanks.  Dr H 

## 2018-06-18 NOTE — Telephone Encounter (Signed)
New message     Pt c/o Syncope: STAT if syncope occurred within 30 minutes and pt complains of lightheadedness High Priority if episode of passing out, completely, today or in last 24 hours   1. Did you pass out today? Yes   2. When is the last time you passed out? This morning and yesterday morning    3. Has this occurred multiple times? Yes   4. Did you have any symptoms prior to passing out? No

## 2018-06-18 NOTE — Telephone Encounter (Signed)
Spoke with patient of Dr. 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 He had no symptoms prior. He reports he is not out for long. Advised that patient go to [facility] ED for evaluation of 2 episodes of syncope vs scheduled OV for today. Explained that more acute testing, labs, work up can be done in ED vs our office. He voiced understanding. He states he has a ride to ED. Notified him I would call ED and inform nurse of his situation. Spoke with Claiborne Billings (nurse first) and provided patient info/situation.   Routed to MD as Juluis Rainier

## 2018-06-19 ENCOUNTER — Other Ambulatory Visit (HOSPITAL_COMMUNITY): Payer: Self-pay | Admitting: Internal Medicine

## 2018-06-19 ENCOUNTER — Ambulatory Visit (HOSPITAL_COMMUNITY)
Admission: RE | Admit: 2018-06-19 | Discharge: 2018-06-19 | Disposition: A | Discharge status: Home / Self Care | Payer: Medicare Other | Source: Ambulatory Visit | Attending: Vascular Surgery | Admitting: Vascular Surgery

## 2018-06-19 DIAGNOSIS — R600 Localized edema: Secondary | ICD-10-CM

## 2018-06-23 ENCOUNTER — Other Ambulatory Visit: Payer: Self-pay | Admitting: Internal Medicine

## 2018-06-23 DIAGNOSIS — R899 Unspecified abnormal finding in specimens from other organs, systems and tissues: Secondary | ICD-10-CM

## 2018-06-23 DIAGNOSIS — R634 Abnormal weight loss: Secondary | ICD-10-CM

## 2018-06-26 DIAGNOSIS — Z6824 Body mass index (BMI) 24.0-24.9, adult: Secondary | ICD-10-CM | POA: Diagnosis not present

## 2018-06-26 DIAGNOSIS — M25579 Pain in unspecified ankle and joints of unspecified foot: Secondary | ICD-10-CM | POA: Diagnosis not present

## 2018-06-26 DIAGNOSIS — R634 Abnormal weight loss: Secondary | ICD-10-CM | POA: Diagnosis not present

## 2018-06-26 DIAGNOSIS — I10 Essential (primary) hypertension: Secondary | ICD-10-CM | POA: Diagnosis not present

## 2018-06-26 DIAGNOSIS — R55 Syncope and collapse: Secondary | ICD-10-CM | POA: Diagnosis not present

## 2018-06-27 ENCOUNTER — Ambulatory Visit: Payer: Medicare Other | Admitting: Cardiology

## 2018-07-03 ENCOUNTER — Encounter: Payer: Self-pay | Admitting: Cardiology

## 2018-07-03 ENCOUNTER — Ambulatory Visit (INDEPENDENT_AMBULATORY_CARE_PROVIDER_SITE_OTHER): Payer: Medicare Other | Admitting: Cardiology

## 2018-07-03 DIAGNOSIS — R634 Abnormal weight loss: Secondary | ICD-10-CM | POA: Diagnosis not present

## 2018-07-03 DIAGNOSIS — R55 Syncope and collapse: Secondary | ICD-10-CM | POA: Diagnosis not present

## 2018-07-03 DIAGNOSIS — I350 Nonrheumatic aortic (valve) stenosis: Secondary | ICD-10-CM | POA: Diagnosis not present

## 2018-07-03 DIAGNOSIS — E782 Mixed hyperlipidemia: Secondary | ICD-10-CM

## 2018-07-03 DIAGNOSIS — I2510 Atherosclerotic heart disease of native coronary artery without angina pectoris: Secondary | ICD-10-CM

## 2018-07-03 NOTE — Assessment & Plan Note (Signed)
2 episodes in one day two weeks ago- no recurrence

## 2018-07-03 NOTE — Assessment & Plan Note (Signed)
Work up per PCP

## 2018-07-03 NOTE — Assessment & Plan Note (Signed)
Coroanry Ca++ on prior CT

## 2018-07-03 NOTE — Assessment & Plan Note (Signed)
Per PCP 

## 2018-07-03 NOTE — Patient Instructions (Signed)
Medication Instructions:  Your physician recommends that you continue on your current medications as directed. Please refer to the Current Medication list given to you today.  If you need a refill on your cardiac medications before your next appointment, please call your pharmacy.    Testing/Procedures:  [facility] office: Your physician has requested that you have an echocardiogram. Echocardiography is a painless test that uses sound waves to create images of your heart. It provides your doctor with information about the size and shape of your heart and how well your heart's chambers and valves are working. This procedure takes approximately one hour. There are no restrictions for this procedure.  Your physician has recommended that you wear a 7 day event monitor. Event monitors are medical devices that record the heart's electrical activity. Doctors most often [place] these monitors to diagnose arrhythmias. Arrhythmias are problems with the speed or rhythm of the heartbeat. The monitor is a small, portable device. You can wear one while you do your normal daily activities. This is usually used to diagnose what is causing palpitations/syncope (passing out).  [facility] office: Your physician has requested that you have an exercise stress myoview. For further information please visit [email or web address]. Please follow instruction sheet, as given.  Follow-Up: At [facility], you and your health needs are our priority.  As part of our continuing mission to provide you with exceptional heart care, we have created designated Provider Care Teams.  These Care Teams include your primary Cardiologist (physician) and Advanced Practice Providers (APPs -  Physician Assistants and Nurse Practitioners) who all work together to provide you with the care you need, when you need it. You will need a follow up appointment with Dr. Debara Pickett (ONLY) in 3-4 weeks.  Advanced Practice Providers on your designated Care  Team: [place], [place] . Fabian Sharp, PA-C  Any Other Special Instructions Will Be Listed Below (If Applicable). None

## 2018-07-03 NOTE — Assessment & Plan Note (Signed)
H/O mils AS when in [facility]- check echo

## 2018-07-03 NOTE — Progress Notes (Signed)
07/03/2018 Aaron Miller   11/02/1945  [id number]  Primary Physician Crist Infante, MD Primary Cardiologist: Dr Debara Pickett  HPI: [name] is seen in the office today for cardiology follow-up.  He has been seen by Dr. Debara Pickett previously, the patient has a history of coronary artery calcification on the CT scan, enlarged aortic root at 4.2 cm, mild AS, and dyslipidemia.  He has a history of prior stress test while in [facility].  He recently had 2 syncopal spells in 1 day.  This was 2 weeks ago.  He has not had recurrence.  Interestingly, his main complaint to me is leg pain.  He says this is been evaluated by his primary care provider, neurologist, and a rheumatologist and he is frustrated that no one can give him a diagnosis.  The syncopal incidence was more of an after thought to him.  The patient says on the day of the event he had gotten up to go get a piece of toast.  It was about 5:30 in the morning.  He was standing at the counter bettering his toast when he felt like he was going to blackout.  He called out to his wife and started walking down the hall, she found him on the floor.  He was pale and diaphoretic.  He went to lay down for a few minutes and felt better.  Denies any presyncopal symptoms such as tachycardia or chest pain.  Later that night he had another episode on the way back from the bathroom.  After the first episode he called our office and we instructed him to go to the emergency room.  He actually went to Dr. Silvestre Mesi office.    Since that episode he has not had any further syncope or near syncope.  He tells me he walks 1-2 miles a day.  He denies unusual dyspnea.  He was somewhat confused about his medications, fortunately I had Dr. Silvestre Mesi office notes.  He is also being worked up for lung nodules and unexplained weight loss, he has an abdominal CT and non contrast chest CT scheduled.     Current Outpatient Medications  Medication Sig Dispense Refill  . aspirin 81 MG tablet Take 81  mg by mouth daily.    . Evolocumab (REPATHA SURECLICK) 324 MG/ML SOAJ Inject 1 Dose into the skin every 14 (fourteen) days. 2 pen 11  . telmisartan (MICARDIS) 20 MG tablet Take 20 mg by mouth daily.     No current facility-administered medications for this visit.     Allergies  Allergen Reactions  . Statins Other (See Comments)    Myalgias - Crestor, pravastatin    Past Medical History:  Diagnosis Date  . Hyperlipidemia   . Hypertension     Social History   Socioeconomic History  . Marital status: Married    Spouse name: Not on file  . Number of children: Not on file  . Years of education: Not on file  . Highest education level: Not on file  Occupational History  . Not on file  Social Needs  . Financial resource strain: Not on file  . Food insecurity:    Worry: Not on file    Inability: Not on file  . Transportation needs:    Medical: Not on file    Non-medical: Not on file  Tobacco Use  . Smoking status: Former Research scientist (life sciences)  . Smokeless tobacco: Never Used  Substance and Sexual Activity  . Alcohol use: Not on file  Comment: 2 glasses of wine daily  . Drug use: Never  . Sexual activity: Not on file  Lifestyle  . Physical activity:    Days per week: Not on file    Minutes per session: Not on file  . Stress: Not on file  Relationships  . Social connections:    Talks on phone: Not on file    Gets together: Not on file    Attends religious service: Not on file    Active member of club or organization: Not on file    Attends meetings of clubs or organizations: Not on file    Relationship status: Not on file  . Intimate partner violence:    Fear of current or ex partner: Not on file    Emotionally abused: Not on file    Physically abused: Not on file    Forced sexual activity: Not on file  Other Topics Concern  . Not on file  Social History Narrative   Married.  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  Masters degree      Family History  Problem Relation Age of Onset  . Lung cancer Mother   . Hypertension Father   . Diabetes Father   . Lung cancer Maternal Grandmother      Review of Systems: General: negative for chills, fever, night sweats   chronic pain in his lower legs at night Cardiovascular: negative for chest pain, dyspnea on exertion, edema, orthopnea, palpitations, paroxysmal nocturnal dyspnea or shortness of breath Dermatological: negative for rash Respiratory: negative for cough or wheezing Urologic: negative for hematuria Abdominal: negative for nausea, vomiting, diarrhea, bright red blood per rectum, melena, or hematemesis Neurologic: negative for visual changes, syncope, or dizziness All other systems reviewed and are otherwise negative except as noted above.    Blood pressure (!) 142/80, pulse 68, height 5\' 8"  (1.727 m), weight 159 lb (72.1 kg).  Orthostatic B/P- laying 135/76, sitting 122/79, standing 127/79 General appearance: alert, cooperative and no distress Neck: no carotid bruit and no JVD Lungs: clear to auscultation bilaterally Heart: regular rate and rhythm Extremities: skin is thick and smooth- no edema, 2-3+/4 DP pulses bliateraly Skin: Skin color, texture, turgor normal. No rashes or lesions or warm and dry Neurologic: Grossly normal  EKG NSR, LVH  ASSESSMENT AND PLAN:   Syncope and collapse 2 episodes in one day two weeks ago- no recurrence   CAD (coronary artery disease) Coroanry Ca++ on prior CT  Aortic valve stenosis H/O mils AS when in [place]- check echo  Mixed hyperlipidemia Per PCP  Weight loss, unintentional Work up per PCP   PLAN  Syncope may have been orthostatic secondary to medication- he was taken off Amlodipine and placed on Olmesartan around that time- but I feel more definitive work up is indicated.  I ordered an echo, Event monitor, and GXT Myoview.  He will f/u with dr Debara Pickett after this.  I suggested he not drive until work up is  completed and he is cleared by Dr Debara Pickett.   Kerin Ransom PA-C 07/03/2018 10:45 AM

## 2018-07-07 ENCOUNTER — Ambulatory Visit
Admission: RE | Admit: 2018-07-07 | Discharge: 2018-07-07 | Disposition: A | Discharge status: Home / Self Care | Payer: Medicare Other | Source: Ambulatory Visit | Attending: Internal Medicine | Admitting: Internal Medicine

## 2018-07-07 ENCOUNTER — Other Ambulatory Visit: Payer: Self-pay | Admitting: Internal Medicine

## 2018-07-07 ENCOUNTER — Other Ambulatory Visit: Payer: Medicare Other

## 2018-07-07 DIAGNOSIS — R634 Abnormal weight loss: Secondary | ICD-10-CM

## 2018-07-07 DIAGNOSIS — R911 Solitary pulmonary nodule: Secondary | ICD-10-CM

## 2018-07-07 DIAGNOSIS — R918 Other nonspecific abnormal finding of lung field: Secondary | ICD-10-CM | POA: Diagnosis not present

## 2018-07-07 DIAGNOSIS — R899 Unspecified abnormal finding in specimens from other organs, systems and tissues: Secondary | ICD-10-CM

## 2018-07-07 MED ORDER — IOPAMIDOL (ISOVUE-300) INJECTION 61%
100.0000 mL | Freq: Once | INTRAVENOUS | Status: AC | PRN
  Administered 2018-07-07: 100 mL via INTRAVENOUS

## 2018-07-09 ENCOUNTER — Telehealth (HOSPITAL_COMMUNITY): Payer: Self-pay

## 2018-07-09 NOTE — Telephone Encounter (Signed)
Encounter complete. 

## 2018-07-10 ENCOUNTER — Encounter (HOSPITAL_COMMUNITY): Payer: Medicare Other

## 2018-07-15 ENCOUNTER — Ambulatory Visit (HOSPITAL_COMMUNITY)
Admission: RE | Admit: 2018-07-15 | Discharge: 2018-07-15 | Disposition: A | Discharge status: Home / Self Care | Payer: Medicare Other | Source: Ambulatory Visit | Attending: Cardiology | Admitting: Cardiology

## 2018-07-15 DIAGNOSIS — R55 Syncope and collapse: Secondary | ICD-10-CM | POA: Insufficient documentation

## 2018-07-15 LAB — MYOCARDIAL PERFUSION IMAGING
Estimated workload: 7 METS
Exercise duration (min): 6 min
Exercise duration (sec): 31 s
LV dias vol: 108 mL (ref 62–150)
LV sys vol: 66 mL
MPHR: 148 {beats}/min
Peak HR: 137 {beats}/min
Percent HR: 92 %
RPE: 19
Rest HR: 65 {beats}/min
SDS: 2
SRS: 1
SSS: 3
TID: 1.01

## 2018-07-15 MED ORDER — TECHNETIUM TC 99M TETROFOSMIN IV KIT
9.8000 | PACK | Freq: Once | INTRAVENOUS | Status: AC | PRN
  Administered 2018-07-15: 9.8 via INTRAVENOUS
  Filled 2018-07-15: qty 10

## 2018-07-15 MED ORDER — TECHNETIUM TC 99M TETROFOSMIN IV KIT
31.1000 | PACK | Freq: Once | INTRAVENOUS | Status: AC | PRN
  Administered 2018-07-15: 31.1 via INTRAVENOUS
  Filled 2018-07-15: qty 32

## 2018-07-16 ENCOUNTER — Ambulatory Visit (INDEPENDENT_AMBULATORY_CARE_PROVIDER_SITE_OTHER): Payer: Medicare Other

## 2018-07-16 ENCOUNTER — Ambulatory Visit (HOSPITAL_COMMUNITY): Payer: Medicare Other | Attending: Internal Medicine

## 2018-07-16 DIAGNOSIS — R55 Syncope and collapse: Secondary | ICD-10-CM | POA: Diagnosis not present

## 2018-07-29 DIAGNOSIS — M109 Gout, unspecified: Secondary | ICD-10-CM | POA: Diagnosis not present

## 2018-07-29 DIAGNOSIS — I10 Essential (primary) hypertension: Secondary | ICD-10-CM | POA: Diagnosis not present

## 2018-07-29 DIAGNOSIS — G2581 Restless legs syndrome: Secondary | ICD-10-CM | POA: Diagnosis not present

## 2018-07-29 DIAGNOSIS — Z6825 Body mass index (BMI) 25.0-25.9, adult: Secondary | ICD-10-CM | POA: Diagnosis not present

## 2018-07-29 DIAGNOSIS — E7849 Other hyperlipidemia: Secondary | ICD-10-CM | POA: Diagnosis not present

## 2018-07-29 DIAGNOSIS — I2510 Atherosclerotic heart disease of native coronary artery without angina pectoris: Secondary | ICD-10-CM | POA: Diagnosis not present

## 2018-07-29 DIAGNOSIS — M25579 Pain in unspecified ankle and joints of unspecified foot: Secondary | ICD-10-CM | POA: Diagnosis not present

## 2018-08-08 ENCOUNTER — Telehealth: Payer: Self-pay | Admitting: Internal Medicine

## 2018-08-08 NOTE — Telephone Encounter (Signed)
Returned the pt call. Pt sts that he would like to cancel his 08/13/18 appt with Dr.Hilty until May 2020 due to the COVID-19 pandemic. He does not want to risk it. He would like a telephone call from the MD if needed.  Adv the pt that we have been contacting pts that are scheduled for routine f/u and rescheduling appts were indicated. He feels as though he doing ok from a cardiac standpoint, denies any reoccurrence of syncope.  Adv the pt that I have cancelled the appt. I will fwd the update to Dr.Hilty to see if a "telehealth appt" or sooner f/u appt is needed.   Adv him to contact the office sooner symptoms develop or if he has any questions or concerns. Pt verbalized understanding.

## 2018-08-08 NOTE — Telephone Encounter (Signed)
° °  Patient requesting TELE visit due to covid19. Please advise

## 2018-08-13 ENCOUNTER — Ambulatory Visit: Payer: Medicare Other | Admitting: Internal Medicine

## 2018-08-21 NOTE — Progress Notes (Signed)
   Virtual Visit via Video Note The purpose of this virtual visit is to provide medical care while limiting exposure to the novel coronavirus.    Consent was obtained for video visit:  Yes.   Answered questions that patient had about telehealth interaction:  Yes.   I discussed the limitations, risks, security and privacy concerns of performing an evaluation and management service by telemedicine. I also discussed with the patient that there may be a patient responsible charge related to this service. The patient expressed understanding and agreed to proceed.  Pt location: Home Physician Location: office Name of referring provider:  Crist Infante, MD I connected with Aaron Miller at patients initiation/request on 08/22/2018 at 10:00 AM EDT by video enabled telemedicine application and verified that I am speaking with the correct person using two identifiers. Pt MRN:  [id number] Pt DOB:  Jan 03, 1946 Video Participants:  Aaron Miller   History of Present Illness: This is a 73 year-old man returning for evaluation of bilateral ankle pain. In January 2020, he was started on ropinirole 0.25mg  at bedtime as a trial to see if symptoms could be related to RLS.  He did not pick up this medication.  He continues to have sharp pain of both ankles with associated swelling and redness.  He did see rheumatology who did not find rheumatological reason for this symptoms.      Observations/Objective:   Vitals:   08/22/18 0952  BP: (!) 150/91  Weight: 160 lb (72.6 kg)  Height: 5\' 8"  (1.727 m)  He is awake, alert, and appears comfortable Face is symmetric, no ptosis. Antigravity in all extremities. Gait appears normal  DATA: Lab Results  Component Value Date   FERRITIN 154.9 06/13/2018    Assessment and Plan:   Bilateral feet and leg pain, ?restless leg syndrome, although ankle edema and erythema would not be consistent with RLS.   - Ferritin is normal  - Start trial of ropinirole 0.25mg  3 hr  before bedtime to see if this could be RLS  - Side effects discussed of medication, including monitoring for signs of obbessive-compulsive behavior   Follow Up Instructions:   I discussed the assessment and treatment plan with the patient. The patient was provided an opportunity to ask questions and all were answered. The patient agreed with the plan and demonstrated an understanding of the instructions.   The patient was advised to call back or seek an in-person evaluation if the symptoms worsen or if the condition fails to improve as anticipated.  Return to clinic in 6 months  Total Time spent in visit with the patient was:  15 minutes, of which more than 50% of the time was spent in counseling and/or coordinating care.   Pt understands and agrees with the plan of care outlined.     Alda Berthold, DO

## 2018-08-22 ENCOUNTER — Telehealth (INDEPENDENT_AMBULATORY_CARE_PROVIDER_SITE_OTHER): Payer: Medicare Other | Admitting: Neurology

## 2018-08-22 ENCOUNTER — Encounter: Payer: Self-pay | Admitting: Neurology

## 2018-08-22 ENCOUNTER — Other Ambulatory Visit: Payer: Self-pay

## 2018-08-22 ENCOUNTER — Encounter: Payer: Self-pay | Admitting: *Deleted

## 2018-08-22 VITALS — BP 150/91 | Ht 68.0 in | Wt 160.0 lb

## 2018-08-22 DIAGNOSIS — G2581 Restless legs syndrome: Secondary | ICD-10-CM

## 2018-08-22 MED ORDER — ROPINIROLE HCL 0.25 MG PO TABS: ORAL_TABLET | ORAL | 5 refills | Status: DC

## 2018-09-24 ENCOUNTER — Ambulatory Visit: Payer: Medicare Other | Admitting: Neurology

## 2018-10-07 ENCOUNTER — Telehealth: Payer: Self-pay | Admitting: Internal Medicine

## 2018-10-07 NOTE — Telephone Encounter (Signed)
Smartphone/ consent/ decline my chart /pre reg completed

## 2018-10-08 ENCOUNTER — Telehealth: Payer: Self-pay

## 2018-10-08 NOTE — Telephone Encounter (Signed)
Virtual Visit Pre-Appointment Phone Call  "(Name), I am calling you today to discuss your upcoming appointment. We are currently trying to limit exposure to the virus that causes COVID-19 by seeing patients at home rather than in the office."  1. "What is the BEST phone number to call the day of the visit?" - include this in appointment notes  2. "Do you have or have access to (through a family member/friend) a smartphone with video capability that we can use for your visit?" a. If yes - list this number in appt notes as "cell" (if different from BEST phone #) and list the appointment type as a VIDEO visit in appointment notes b. If no - list the appointment type as a PHONE visit in appointment notes  3. Confirm consent - "In the setting of the current Covid19 crisis, you are scheduled for a (phone or video) visit with your provider on (date) at (time).  Just as we do with many in-office visits, in order for you to participate in this visit, we must obtain consent.  If you'd like, I can send this to your mychart (if signed up) or email for you to review.  Otherwise, I can obtain your verbal consent now.  All virtual visits are billed to your insurance company just like a normal visit would be.  By agreeing to a virtual visit, we'd like you to understand that the technology does not allow for your provider to perform an examination, and thus may limit your provider's ability to fully assess your condition. If your provider identifies any concerns that need to be evaluated in person, we will make arrangements to do so.  Finally, though the technology is pretty good, we cannot assure that it will always work on either your or our end, and in the setting of a video visit, we may have to convert it to a phone-only visit.  In either situation, we cannot ensure that we have a secure connection.  Are you willing to proceed?" STAFF: Did the patient verbally acknowledge consent to telehealth visit? Document  YES/NO here: consent obtained yesterday, 10/07/18, by Aaron Miller  4. Advise patient to be prepared - "Two hours prior to your appointment, go ahead and check your blood pressure, pulse, oxygen saturation, and your weight (if you have the equipment to check those) and write them all down. When your visit starts, your provider will ask you for this information. If you have an Apple Watch or Kardia device, please plan to have heart rate information ready on the day of your appointment. Please have a pen and paper handy nearby the day of the visit as well."  5. Give patient instructions for MyChart download to smartphone OR Doximity/Doxy.me as below if video visit (depending on what platform provider is using)  6. Inform patient they will receive a phone call 15 minutes prior to their appointment time (may be from unknown caller ID) so they should be prepared to answer    TELEPHONE CALL NOTE  Aaron Miller has been deemed a candidate for a follow-up tele-health visit to limit community exposure during the Covid-19 pandemic. I spoke with the patient via phone to ensure availability of phone/video source, confirm preferred email & phone number, and discuss instructions and expectations.  I reminded Aaron Miller to be prepared with any vital sign and/or heart rhythm information that could potentially be obtained via home monitoring, at the time of his visit. I reminded Aaron Miller to  expect a phone call prior to his visit.  Aaron Miller, Aaron Miller 10/08/2018 1:29 PM   INSTRUCTIONS FOR DOWNLOADING THE MYCHART APP TO SMARTPHONE  - The patient must first make sure to have activated MyChart and know their login information - If Apple, go to [employer] and type in MyChart in the search bar and download the app. If Android, ask patient to go to [employer] and type in [place] in the search bar and download the app. The app is free but as with any other app downloads, their phone may require them  to verify saved payment information or Apple/Android password.  - The patient will need to then log into the app with their MyChart username and password, and select [facility] as their healthcare provider to link the account. When it is time for your visit, go to the MyChart app, find appointments, and click Begin Video Visit. Be sure to Select Allow for your device to access the Microphone and Camera for your visit. You will then be connected, and your provider will be with you shortly.  **If they have any issues connecting, or need assistance please contact MyChart service desk [phone])**  **If using a computer, in order to ensure the best quality for their visit they will need to use either of the following Internet Browsers: [employer], or Google Chrome**  IF USING DOXIMITY or DOXY.ME - The patient will receive a link just prior to their visit by text.     FULL LENGTH CONSENT FOR TELE-HEALTH VISIT   I hereby voluntarily request, consent and authorize [facility] and its employed or contracted physicians, physician assistants, nurse practitioners or other licensed health care professionals (the Practitioner), to provide me with telemedicine health care services (the "Services") as deemed necessary by the treating Practitioner. I acknowledge and consent to receive the Services by the Practitioner via telemedicine. I understand that the telemedicine visit will involve communicating with the Practitioner through live audiovisual communication technology and the disclosure of certain medical information by electronic transmission. I acknowledge that I have been given the opportunity to request an in-person assessment or other available alternative prior to the telemedicine visit and am voluntarily participating in the telemedicine visit.  I understand that I have the right to withhold or withdraw my consent to the use of telemedicine in the course of my care at any time,  without affecting my right to future care or treatment, and that the Practitioner or I may terminate the telemedicine visit at any time. I understand that I have the right to inspect all information obtained and/or recorded in the course of the telemedicine visit and may receive copies of available information for a reasonable fee.  I understand that some of the potential risks of receiving the Services via telemedicine include:  Marland Kitchen Delay or interruption in medical evaluation due to technological equipment failure or disruption; . Information transmitted may not be sufficient (e.g. poor resolution of images) to allow for appropriate medical decision making by the Practitioner; and/or  . In rare instances, security protocols could fail, causing a breach of personal health information.  Furthermore, I acknowledge that it is my responsibility to provide information about my medical history, conditions and care that is complete and accurate to the best of my ability. I acknowledge that Practitioner's advice, recommendations, and/or decision may be based on factors not within their control, such as incomplete or inaccurate data provided by me or distortions of diagnostic images or specimens that  may result from electronic transmissions. I understand that the practice of medicine is not an exact science and that Practitioner makes no warranties or guarantees regarding treatment outcomes. I acknowledge that I will receive a copy of this consent concurrently upon execution via email to the email address I last provided but may also request a printed copy by calling the office of [facility].    I understand that my insurance will be billed for this visit.   I have read or had this consent read to me. . I understand the contents of this consent, which adequately explains the benefits and risks of the Services being provided via telemedicine.  . I have been provided ample opportunity to ask questions regarding  this consent and the Services and have had my questions answered to my satisfaction. . I give my informed consent for the services to be provided through the use of telemedicine in my medical care  By participating in this telemedicine visit I agree to the above.

## 2018-10-09 ENCOUNTER — Telehealth (INDEPENDENT_AMBULATORY_CARE_PROVIDER_SITE_OTHER): Payer: Medicare Other | Admitting: Internal Medicine

## 2018-10-09 ENCOUNTER — Telehealth: Payer: Self-pay | Admitting: Internal Medicine

## 2018-10-09 ENCOUNTER — Encounter: Payer: Self-pay | Admitting: Internal Medicine

## 2018-10-09 VITALS — BP 123/78 | HR 76 | Ht 68.0 in | Wt 160.0 lb

## 2018-10-09 DIAGNOSIS — Z789 Other specified health status: Secondary | ICD-10-CM

## 2018-10-09 DIAGNOSIS — I2510 Atherosclerotic heart disease of native coronary artery without angina pectoris: Secondary | ICD-10-CM | POA: Diagnosis not present

## 2018-10-09 DIAGNOSIS — I712 Thoracic aortic aneurysm, without rupture, unspecified: Secondary | ICD-10-CM

## 2018-10-09 DIAGNOSIS — E782 Mixed hyperlipidemia: Secondary | ICD-10-CM | POA: Diagnosis not present

## 2018-10-09 DIAGNOSIS — Z7189 Other specified counseling: Secondary | ICD-10-CM

## 2018-10-09 DIAGNOSIS — I350 Nonrheumatic aortic (valve) stenosis: Secondary | ICD-10-CM

## 2018-10-09 DIAGNOSIS — I7121 Aneurysm of the ascending aorta, without rupture: Secondary | ICD-10-CM | POA: Insufficient documentation

## 2018-10-09 DIAGNOSIS — R55 Syncope and collapse: Secondary | ICD-10-CM

## 2018-10-09 DIAGNOSIS — R911 Solitary pulmonary nodule: Secondary | ICD-10-CM

## 2018-10-09 NOTE — Patient Instructions (Signed)
Medication Instructions:  Your physician recommends that you continue on your current medications as directed. Please refer to the Current Medication list given to you today.  If you need a refill on your cardiac medications before your next appointment, please call your pharmacy.   Lab work: FASTING lab work at your primary care provider's office (to check cholesterol) If you have labs (blood work) drawn today and your tests are completely normal, you will receive your results only by: Marland Kitchen MyChart Message (if you have MyChart) OR . A paper copy in the mail If you have any lab test that is abnormal or we need to change your treatment, we will call you to review the results.  Testing/Procedures: CT angiogram of chest/aorta to follow up on CT test from Feb. 2020. This it to be completed approximately May 2020 (prior to next visit with Dr. Debara Pickett)  Follow-Up: At [facility], you and your health needs are our priority.  As part of our continuing mission to provide you with exceptional heart care, we have created designated Provider Care Teams.  These Care Teams include your primary Cardiologist (physician) and Advanced Practice Providers (APPs -  Physician Assistants and Nurse Practitioners) who all work together to provide you with the care you need, when you need it. You will need a follow up appointment in 12 months.  Please call our office 2 months in advance to schedule this appointment.  You may see Pixie Casino, MD or one of the following Advanced Practice Providers on your designated Care Team: [place], [place] . Fabian Sharp, PA-C  Any Other Special Instructions Will Be Listed Below (If Applicable).

## 2018-10-09 NOTE — Telephone Encounter (Signed)
Patient called to review e-visit instructions. Patient aware that the following changes have been made: NONE Patient aware that they will need the following labs: fasting lab work - can be done at PCP Patient aware that they will need the following test(s): CT angiogram chest/aorta to be completed in 1 year for aneurysm f/up & nodule f/up seen on 06/2018 CT ordered by PCP  Recall for 1 year entered.   Left message to call back to discuss

## 2018-10-09 NOTE — Progress Notes (Signed)
Virtual Visit via Video Note   This visit type was conducted due to national recommendations for restrictions regarding the COVID-19 Pandemic (e.g. social distancing) in an effort to limit this patient's exposure and mitigate transmission in our community.  Due to his co-morbid illnesses, this patient is at least at moderate risk for complications without adequate follow up.  This format is felt to be most appropriate for this patient at this time.  All issues noted in this document were discussed and addressed.  A limited physical exam was performed with this format.  Please refer to the patient's chart for his consent to telehealth for [facility].   Evaluation Performed:  Doximity video visit  Date:  10/09/2018   ID:  Aaron, Miller 1946/04/13, MRN [id number]  Patient Location:  [place] [address]  Provider location:   [address], [place], [place] [address]  PCP:  Crist Infante, MD  Cardiologist:  Pixie Casino, MD Electrophysiologist:  None   Chief Complaint:  No complaints  History of Present Illness:    Aaron ZEHNER is a 73 y.o. male who presents via audio/video conferencing for a telehealth visit today.  Aaron Miller was seen today for video follow-up.  He denies any chest pain, worsening shortness of breath or associated symptoms.  In February he had 2 syncopal episodes and was referred back for this.  He saw Kerin Ransom, PA-C ordered an echo, monitor as well as a nuclear stress test.  The nuclear stress test was negative for ischemia.  The echo showed normal systolic function with diastolic dysfunction and a dilated ascending aorta to 4.3 cm.  He also had had a CT scan which demonstrated a 4.3 cm ascending aortic aneurysm as well as some pulmonary nodules which will require follow-up.  Finally, a monitor was performed which I personally reviewed and was normal without any arrhythmias.  He had 2 syncopal episodes which both sound somewhat  vasovagal.  He did have some prodrome.  They occurred early in the morning when he was up making breakfast and he had some feeling of dizziness and then walked down the hallway and passed out.  He denies any palpitations, chest pain or other antecedent events.  He was in the process of changing blood pressure medicines from amlodipine to telmisartan low-dose.  The patient does not have symptoms concerning for COVID-19 infection (fever, chills, cough, or new SHORTNESS OF BREATH).    Prior CV studies:   The following studies were reviewed today:  Review  PMHx:  Past Medical History:  Diagnosis Date  . Hyperlipidemia   . Hypertension     No past surgical history on file.  FAMHx:  Family History  Problem Relation Age of Onset  . Lung cancer Mother   . Hypertension Father   . Diabetes Father   . Lung cancer Maternal Grandmother     SOCHx:   reports that he has quit smoking. He has never used smokeless tobacco. He reports that he does not use drugs. No history on file for alcohol.  ALLERGIES:  Allergies  Allergen Reactions  . Statins Other (See Comments)    Myalgias - Crestor, pravastatin    MEDS:  Current Meds  Medication Sig  . aspirin 81 MG tablet Take 81 mg by mouth daily.  . Evolocumab (REPATHA SURECLICK) 858 MG/ML SOAJ Inject 1 Dose into the skin every 14 (fourteen) days.  . mirtazapine (REMERON) 15 MG tablet Take 15 mg by  mouth at bedtime.  Marland Kitchen telmisartan (MICARDIS) 20 MG tablet Take 20 mg by mouth daily.     ROS: Pertinent items noted in HPI and remainder of comprehensive ROS otherwise negative.  Labs/Other Tests and Data Reviewed:    Recent Labs: No results found for requested labs within last 8760 hours.   Recent Lipid Panel No results found for: CHOL, TRIG, HDL, CHOLHDL, LDLCALC, LDLDIRECT  Wt Readings from Last 3 Encounters:  10/09/18 160 lb (72.6 kg)  08/22/18 160 lb (72.6 kg)  07/15/18 159 lb (72.1 kg)     Exam:    Vital Signs:  BP 123/78    Pulse 76   Ht 5\' 8"  (1.727 m)   Wt 160 lb (72.6 kg)   BMI 24.33 kg/m    General appearance: alert and no distress Lungs: no visual respiratory distress Abdomen: normal weight Extremities: extremities normal, atraumatic, no cyanosis or edema Skin: Skin color, texture, turgor normal. No rashes or lesions Neurologic: Mental status: Alert, oriented, thought content appropriate Psych: Pleasant  ASSESSMENT & PLAN:    1. Vasovagal syncope 2. Dilated ascending aorta to 4.3 cm 3. Mixed dyslipidemia 4. Statin intolerance 5. Aortic sclerosis with mild aortic insufficiency (06/2018), EF 55-60% 6. Negative myoview stress test (06/2018)  Aaron Miller is doing much better and has had no further syncopal episodes.  This sounded like a vasovagal episode.  He does have a dilated a sending aorta seen both on echo and CT as well as pulmonary nodules which will need follow-up.  I will order repeat CT scan of the aorta to follow this up in a year.  He has been statin intolerant in fact had myalgias and was seen by neurology.  Eventually these subsided.  He seems to be doing very well with Repatha.  He has not had repeat labs which we will go ahead and order and he will have drawn at [place].  We will continue to prescribe his Repatha.  Plan follow-up with me in 1 year.  COVID-19 Education: The signs and symptoms of COVID-19 were discussed with the patient and how to seek care for testing (follow up with PCP or arrange E-visit).  The importance of social distancing was discussed today.  Patient Risk:   After full review of this patients clinical status, I feel that they are at least moderate risk at this time.  Time:   Today, I have spent 45 minutes with the patient with telehealth technology discussing syncope, dilated ascending aorta, dyslipidemia, statin intolerance.     Medication Adjustments/Labs and Tests Ordered: Current medicines are reviewed at length with the patient today.  Concerns  regarding medicines are outlined above.   Tests Ordered: No orders of the defined types were placed in this encounter.   Medication Changes: No orders of the defined types were placed in this encounter.   Disposition:  in 1 year(s)  Pixie Casino, MD, [facility], [place] Director of the Advanced Lipid Disorders &  Cardiovascular Risk Reduction Clinic Diplomate of the American Board of Clinical Lipidology Attending Cardiologist  Direct Dial: [phone]  Fax: [phone]  Website:  [email or web address]  Pixie Casino, MD  10/09/2018 8:39 AM

## 2018-10-22 DIAGNOSIS — E7849 Other hyperlipidemia: Secondary | ICD-10-CM | POA: Diagnosis not present

## 2018-10-24 DIAGNOSIS — M7751 Other enthesopathy of right foot: Secondary | ICD-10-CM | POA: Diagnosis not present

## 2018-10-24 DIAGNOSIS — M19072 Primary osteoarthritis, left ankle and foot: Secondary | ICD-10-CM | POA: Diagnosis not present

## 2018-10-24 DIAGNOSIS — M19071 Primary osteoarthritis, right ankle and foot: Secondary | ICD-10-CM | POA: Diagnosis not present

## 2018-10-24 DIAGNOSIS — M659 Synovitis and tenosynovitis, unspecified: Secondary | ICD-10-CM | POA: Diagnosis not present

## 2018-10-24 DIAGNOSIS — M7752 Other enthesopathy of left foot: Secondary | ICD-10-CM | POA: Diagnosis not present

## 2018-10-31 DIAGNOSIS — M67371 Transient synovitis, right ankle and foot: Secondary | ICD-10-CM | POA: Diagnosis not present

## 2018-11-14 DIAGNOSIS — M67371 Transient synovitis, right ankle and foot: Secondary | ICD-10-CM | POA: Diagnosis not present

## 2018-11-14 DIAGNOSIS — G5790 Unspecified mononeuropathy of unspecified lower limb: Secondary | ICD-10-CM | POA: Diagnosis not present

## 2018-11-17 ENCOUNTER — Telehealth: Payer: Self-pay | Admitting: Internal Medicine

## 2018-11-17 NOTE — Telephone Encounter (Signed)
lmtcb - pt is scheduled for ct angio chest /aorta (that Dr. Debara Pickett ordered) on July 21 at 8:45 at [facility].  No solid food 2 hours prior; liquids ok.

## 2018-12-08 ENCOUNTER — Other Ambulatory Visit: Payer: Medicare Other | Admitting: *Deleted

## 2018-12-08 ENCOUNTER — Other Ambulatory Visit: Payer: Self-pay

## 2018-12-08 ENCOUNTER — Telehealth: Payer: Self-pay | Admitting: *Deleted

## 2018-12-08 DIAGNOSIS — I712 Thoracic aortic aneurysm, without rupture: Secondary | ICD-10-CM | POA: Diagnosis not present

## 2018-12-08 DIAGNOSIS — I7121 Aneurysm of the ascending aorta, without rupture: Secondary | ICD-10-CM

## 2018-12-08 LAB — BASIC METABOLIC PANEL
BUN/Creatinine Ratio: 18 (ref 10–24)
BUN: 23 mg/dL (ref 8–27)
CO2: 20 mmol/L (ref 20–29)
Calcium: 9.3 mg/dL (ref 8.6–10.2)
Chloride: 102 mmol/L (ref 96–106)
Creatinine, Ser: 1.28 mg/dL — ABNORMAL HIGH (ref 0.76–1.27)
GFR calc Af Amer: 64 mL/min/{1.73_m2} (ref >59)
GFR calc non Af Amer: 55 mL/min/{1.73_m2} — ABNORMAL LOW (ref >59)
Glucose: 91 mg/dL (ref 65–99)
Potassium: 4.6 mmol/L (ref 3.5–5.2)
Sodium: 139 mmol/L (ref 134–144)

## 2018-12-08 NOTE — Telephone Encounter (Signed)

## 2018-12-09 ENCOUNTER — Ambulatory Visit (INDEPENDENT_AMBULATORY_CARE_PROVIDER_SITE_OTHER)
Admission: RE | Admit: 2018-12-09 | Discharge: 2018-12-09 | Disposition: A | Discharge status: Home / Self Care | Payer: Medicare Other | Source: Ambulatory Visit | Attending: Internal Medicine | Admitting: Internal Medicine

## 2018-12-09 DIAGNOSIS — I712 Thoracic aortic aneurysm, without rupture, unspecified: Secondary | ICD-10-CM

## 2018-12-09 DIAGNOSIS — R911 Solitary pulmonary nodule: Secondary | ICD-10-CM

## 2018-12-09 DIAGNOSIS — I7121 Aneurysm of the ascending aorta, without rupture: Secondary | ICD-10-CM

## 2018-12-09 MED ORDER — IOHEXOL 350 MG/ML SOLN
100.0000 mL | Freq: Once | INTRAVENOUS | Status: AC | PRN
  Administered 2018-12-09: 100 mL via INTRAVENOUS

## 2018-12-11 ENCOUNTER — Other Ambulatory Visit: Payer: Self-pay | Admitting: Internal Medicine

## 2018-12-11 DIAGNOSIS — I712 Thoracic aortic aneurysm, without rupture, unspecified: Secondary | ICD-10-CM

## 2018-12-11 DIAGNOSIS — I7121 Aneurysm of the ascending aorta, without rupture: Secondary | ICD-10-CM

## 2019-01-07 DIAGNOSIS — Z23 Encounter for immunization: Secondary | ICD-10-CM | POA: Diagnosis not present

## 2019-01-14 DIAGNOSIS — M67371 Transient synovitis, right ankle and foot: Secondary | ICD-10-CM | POA: Diagnosis not present

## 2019-01-14 DIAGNOSIS — M67372 Transient synovitis, left ankle and foot: Secondary | ICD-10-CM | POA: Diagnosis not present

## 2019-02-25 ENCOUNTER — Telehealth: Payer: Self-pay | Admitting: Neurology

## 2019-02-25 NOTE — Telephone Encounter (Signed)
Rx sent 

## 2019-02-25 NOTE — Telephone Encounter (Signed)
Requested Prescriptions   Pending Prescriptions Disp Refills  . rOPINIRole (REQUIP) 0.25 MG tablet [Pharmacy Med Name: ROPINIROLE HCL 0.25 MG TABLET] 90 tablet     Sig: TAKE 1 TABLET BY MOUTH 3 HOURS BEFORE BEDTIME.   Rx last filled: Rx not on current medication list. Ok to send in refills?  Pt last seen: 08/22/18  Assessment and Plan:   Bilateral feet and leg pain, ?restless leg syndrome, although ankle edema and erythema would not be consistent with RLS.               - Ferritin is normal              - Start trial of ropinirole 0.25mg  3 hr before bedtime to see if this could be RLS              - Side effects discussed of medication, including monitoring for signs of obbessive-compulsive behavior  Follow up appt scheduled: none

## 2019-03-20 ENCOUNTER — Other Ambulatory Visit: Payer: Self-pay

## 2019-03-20 DIAGNOSIS — Z20822 Contact with and (suspected) exposure to covid-19: Secondary | ICD-10-CM

## 2019-03-20 DIAGNOSIS — Z20828 Contact with and (suspected) exposure to other viral communicable diseases: Secondary | ICD-10-CM | POA: Diagnosis not present

## 2019-03-22 LAB — NOVEL CORONAVIRUS, NAA: SARS-CoV-2, NAA: NOT DETECTED

## 2019-03-24 DIAGNOSIS — R509 Fever, unspecified: Secondary | ICD-10-CM | POA: Diagnosis not present

## 2019-03-24 DIAGNOSIS — M25579 Pain in unspecified ankle and joints of unspecified foot: Secondary | ICD-10-CM | POA: Diagnosis not present

## 2019-03-24 DIAGNOSIS — R6889 Other general symptoms and signs: Secondary | ICD-10-CM | POA: Diagnosis not present

## 2019-03-27 DIAGNOSIS — R509 Fever, unspecified: Secondary | ICD-10-CM | POA: Diagnosis not present

## 2019-04-13 DIAGNOSIS — Z125 Encounter for screening for malignant neoplasm of prostate: Secondary | ICD-10-CM | POA: Diagnosis not present

## 2019-04-13 DIAGNOSIS — I10 Essential (primary) hypertension: Secondary | ICD-10-CM | POA: Diagnosis not present

## 2019-04-13 DIAGNOSIS — R82998 Other abnormal findings in urine: Secondary | ICD-10-CM | POA: Diagnosis not present

## 2019-04-13 DIAGNOSIS — E7849 Other hyperlipidemia: Secondary | ICD-10-CM | POA: Diagnosis not present

## 2019-04-13 DIAGNOSIS — M109 Gout, unspecified: Secondary | ICD-10-CM | POA: Diagnosis not present

## 2019-04-15 DIAGNOSIS — R918 Other nonspecific abnormal finding of lung field: Secondary | ICD-10-CM | POA: Diagnosis not present

## 2019-04-15 DIAGNOSIS — E291 Testicular hypofunction: Secondary | ICD-10-CM | POA: Diagnosis not present

## 2019-04-15 DIAGNOSIS — M25579 Pain in unspecified ankle and joints of unspecified foot: Secondary | ICD-10-CM | POA: Diagnosis not present

## 2019-04-15 DIAGNOSIS — I77819 Aortic ectasia, unspecified site: Secondary | ICD-10-CM | POA: Diagnosis not present

## 2019-04-15 DIAGNOSIS — I700 Atherosclerosis of aorta: Secondary | ICD-10-CM | POA: Diagnosis not present

## 2019-04-15 DIAGNOSIS — R55 Syncope and collapse: Secondary | ICD-10-CM | POA: Diagnosis not present

## 2019-04-15 DIAGNOSIS — Z0000 Encounter for general adult medical examination without abnormal findings: Secondary | ICD-10-CM | POA: Diagnosis not present

## 2019-04-15 DIAGNOSIS — I350 Nonrheumatic aortic (valve) stenosis: Secondary | ICD-10-CM | POA: Diagnosis not present

## 2019-04-15 DIAGNOSIS — I2510 Atherosclerotic heart disease of native coronary artery without angina pectoris: Secondary | ICD-10-CM | POA: Diagnosis not present

## 2019-04-15 DIAGNOSIS — R6889 Other general symptoms and signs: Secondary | ICD-10-CM | POA: Diagnosis not present

## 2019-04-15 DIAGNOSIS — M109 Gout, unspecified: Secondary | ICD-10-CM | POA: Diagnosis not present

## 2019-04-15 DIAGNOSIS — Z1331 Encounter for screening for depression: Secondary | ICD-10-CM | POA: Diagnosis not present

## 2019-04-20 ENCOUNTER — Other Ambulatory Visit: Payer: Self-pay | Admitting: Internal Medicine

## 2019-04-20 DIAGNOSIS — R911 Solitary pulmonary nodule: Secondary | ICD-10-CM

## 2019-04-22 ENCOUNTER — Other Ambulatory Visit: Payer: Self-pay | Admitting: Internal Medicine

## 2019-04-22 MED ORDER — REPATHA SURECLICK 140 MG/ML ~~LOC~~ SOAJ: 1.0000 | SUBCUTANEOUS | 5 refills | Status: DC

## 2019-04-22 NOTE — Telephone Encounter (Signed)
Requested Prescriptions   Signed Prescriptions Disp Refills  . Evolocumab (REPATHA SURECLICK) [id number] MG/ML SOAJ 2 pen 5    Sig: Inject 1 Dose into the skin every 14 (fourteen) days.    Authorizing Provider: Pixie Casino    Ordering User: Raelene Bott, BRANDY L

## 2019-04-22 NOTE — Telephone Encounter (Signed)
°*  STAT* If patient is at the pharmacy, call can be transferred to refill team.   1. Which medications need to be refilled? (please list name of each medication and dose if known)  Evolocumab (REPATHA SURECLICK) [id number] MG/ML SOAJ  2. Which pharmacy/location (including street and city if local pharmacy) is medication to be sent to?  [id number] - [place], [place] - [address] AT [address]  3. Do they need a 30 day or 90 day supply? 1 year's worth  The patient's rx has expired. He needs a new Rx. Pt only has one more injection left, which he will use on Friday

## 2019-04-28 ENCOUNTER — Other Ambulatory Visit: Payer: Self-pay

## 2019-04-28 ENCOUNTER — Ambulatory Visit
Admission: RE | Admit: 2019-04-28 | Discharge: 2019-04-28 | Disposition: A | Discharge status: Home / Self Care | Payer: Medicare Other | Source: Ambulatory Visit | Attending: Internal Medicine | Admitting: Internal Medicine

## 2019-04-28 DIAGNOSIS — R911 Solitary pulmonary nodule: Secondary | ICD-10-CM

## 2019-04-28 DIAGNOSIS — R918 Other nonspecific abnormal finding of lung field: Secondary | ICD-10-CM | POA: Diagnosis not present

## 2019-04-29 ENCOUNTER — Telehealth: Payer: Self-pay | Admitting: Internal Medicine

## 2019-04-29 NOTE — Telephone Encounter (Signed)
[facility] [employer] has an active PA already on file with expiration date of 05/12/2021.  can resubmit request within 60 days of that expiration date to obtain a PA renewal.

## 2019-05-13 DIAGNOSIS — Z1212 Encounter for screening for malignant neoplasm of rectum: Secondary | ICD-10-CM | POA: Diagnosis not present

## 2019-05-13 LAB — IFOBT (OCCULT BLOOD): IFOBT: POSITIVE

## 2019-06-21 ENCOUNTER — Ambulatory Visit: Payer: Medicare Other

## 2019-06-22 DIAGNOSIS — D126 Benign neoplasm of colon, unspecified: Secondary | ICD-10-CM

## 2019-06-22 DIAGNOSIS — K921 Melena: Secondary | ICD-10-CM | POA: Diagnosis not present

## 2019-06-22 HISTORY — DX: Benign neoplasm of colon, unspecified: D12.6

## 2019-06-25 ENCOUNTER — Encounter: Payer: Self-pay | Admitting: Physician Assistant

## 2019-06-25 ENCOUNTER — Ambulatory Visit (INDEPENDENT_AMBULATORY_CARE_PROVIDER_SITE_OTHER): Payer: Medicare Other | Admitting: Physician Assistant

## 2019-06-25 ENCOUNTER — Ambulatory Visit (INDEPENDENT_AMBULATORY_CARE_PROVIDER_SITE_OTHER): Payer: Medicare Other

## 2019-06-25 VITALS — BP 142/88 | HR 84 | Temp 97.4°F | Ht 68.0 in | Wt 163.0 lb

## 2019-06-25 DIAGNOSIS — Z1159 Encounter for screening for other viral diseases: Secondary | ICD-10-CM

## 2019-06-25 DIAGNOSIS — R195 Other fecal abnormalities: Secondary | ICD-10-CM

## 2019-06-25 DIAGNOSIS — Z86010 Personal history of colonic polyps: Secondary | ICD-10-CM | POA: Diagnosis not present

## 2019-06-25 DIAGNOSIS — Z01818 Encounter for other preprocedural examination: Secondary | ICD-10-CM

## 2019-06-25 MED ORDER — NA SULFATE-K SULFATE-MG SULF 17.5-3.13-1.6 GM/177ML PO SOLN: 1.0000 | Freq: Once | ORAL | 0 refills | Status: AC

## 2019-06-25 NOTE — Progress Notes (Signed)
Chief Complaint: Positive Hemosure  HPI:    Aaron Miller is a 74 year old Caucasian male with a past medical history as listed below including aortic stenosis (echo 07/16/2018 with mild aortic sclerosis and LVEF 55-60%), who was referred to me by Crist Infante, MD for a complaint of positive hemosure.      05/13/2019 + Hemosure.  CBC with a normal hemoglobin of 13.5.  Normal CMP.    Today, the patient presents to clinic and explains that early last year he and his wife moved here from [place].  Tells me that he does have a history of colon polyps and was getting a colonoscopy every 3 years.  It has now been 3 years since his last one.  He was told he may need one more before he was done.  Describes that he has no real complaints.  He was just told that his lab test was positive.  He is not seeing any bright red blood in his stools or or black tarry stools.    Social history positive for having 9 grandchildren.  They have not really been able to see them since moving here due to Covid.  They are due to get second vaccine soon.    Denies fever, chills, weight loss, change in bowel habits or abdominal pain.     Past Medical History:  Diagnosis Date  . Hyperlipidemia   . Hypertension     Past Surgical History:  Procedure Laterality Date  . COLONOSCOPY    . NO PAST SURGERIES      Current Outpatient Medications  Medication Sig Dispense Refill  . aspirin 81 MG tablet Take 81 mg by mouth daily.    . Evolocumab (REPATHA SURECLICK) 262 MG/ML SOAJ Inject 1 Dose into the skin every 14 (fourteen) days. 2 pen 5  . mirtazapine (REMERON) 15 MG tablet Take 15 mg by mouth at bedtime.    Marland Kitchen rOPINIRole (REQUIP) 0.25 MG tablet TAKE 1 TABLET BY MOUTH 3 HOURS BEFORE BEDTIME. 90 tablet 1  . telmisartan (MICARDIS) 20 MG tablet Take 20 mg by mouth daily.    . Na Sulfate-K Sulfate-Mg Sulf 17.5-3.13-1.6 GM/177ML SOLN Take 1 kit by mouth once for 1 dose. 354 mL 0   No current facility-administered medications for  this visit.    Allergies as of 06/25/2019 - Review Complete 06/25/2019  Allergen Reaction Noted  . Statins Other (See Comments) 04/11/2018    Family History  Problem Relation Age of Onset  . Lung cancer Mother   . Hypertension Father   . Diabetes Father   . Lung cancer Maternal Grandmother   . Colon cancer Neg Hx   . Colon polyps Neg Hx   . Pancreatic cancer Neg Hx   . Stomach cancer Neg Hx   . Esophageal cancer Neg Hx     Social History   Socioeconomic History  . Marital status: Married    Spouse name: Not on file  . Number of children: Not on file  . Years of education: Not on file  . Highest education level: Not on file  Occupational History  . Not on file  Tobacco Use  . Smoking status: Former Smoker    Types: Cigarettes    Quit date: 2017    Years since quitting: 4.0  . Smokeless tobacco: Never Used  Substance and Sexual Activity  . Alcohol use: Yes    Comment: 2 glasses of wine daily  . Drug use: Never  . Sexual activity: Not on file  Other Topics Concern  . Not on file  Social History Narrative   Married.  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  Masters degree   Social Determinants of Health   Financial Resource Strain:   . Difficulty of Paying Living Expenses: Not on file  Food Insecurity:   . Worried About Charity fundraiser in the Last Year: Not on file  . Ran Out of Food in the Last Year: Not on file  Transportation Needs:   . Lack of Transportation (Medical): Not on file  . Lack of Transportation (Non-Medical): Not on file  Physical Activity:   . Days of Exercise per Week: Not on file  . Minutes of Exercise per Session: Not on file  Stress:   . Feeling of Stress : Not on file  Social Connections:   . Frequency of Communication with Friends and Family: Not on file  . Frequency of Social Gatherings with Friends and Family: Not on file  . Attends Religious Services: Not on file  . Active Member of Clubs or  Organizations: Not on file  . Attends Archivist Meetings: Not on file  . Marital Status: Not on file  Intimate Partner Violence:   . Fear of Current or Ex-Partner: Not on file  . Emotionally Abused: Not on file  . Physically Abused: Not on file  . Sexually Abused: Not on file    Review of Systems:    Constitutional: No weight loss, fever or chills Skin: No rash Cardiovascular: No chest pain Respiratory: No SOB  Gastrointestinal: See HPI and otherwise negative Genitourinary: No dysuria  Neurological: No headache, dizziness or syncope Musculoskeletal: No new muscle or joint pain Hematologic: No bleeding  Psychiatric: No history of depression or anxiety   Physical Exam:  Vital signs: BP (!) 142/88 (BP Location: Left Arm, Patient Position: Sitting, Cuff Size: Normal)   Pulse 84   Temp (!) 97.4 F (36.3 C)   Ht _0  (1.727 m)   Wt 163 lb (73.9 kg)   SpO2 97%   BMI 24.78 kg/m   Constitutional:   Pleasant Caucasian male appears to be in NAD, Well developed, Well nourished, alert and cooperative Head:  Normocephalic and atraumatic. Eyes:   PEERL, EOMI. No icterus. Conjunctiva pink. Ears:  Normal auditory acuity. Neck:  Supple Throat: Oral cavity and pharynx without inflammation, swelling or lesion.  Respiratory: Respirations even and unlabored. Lungs clear to auscultation bilaterally.   No wheezes, crackles, or rhonchi.  Cardiovascular: Normal S1, S2. No MRG. Regular rate and rhythm. No peripheral edema, cyanosis or pallor.  Gastrointestinal:  Soft, nondistended, nontender. No rebound or guarding. Normal bowel sounds. No appreciable masses or hepatomegaly. Rectal:  Not performed.  Msk:  Symmetrical without gross deformities. Without edema, no deformity or joint abnormality.  Neurologic:  Alert and  oriented x4;  grossly normal neurologically.  Skin:   Dry and intact without significant lesions or rashes. Psychiatric: Demonstrates good judgement and reason without  abnormal affect or behaviors.  See HPI for recent labs.  Assessment: 1.  Positive Hemosure: Completed in February by PCP 2.  History of polyps: Last colonoscopy 3+ years ago in [place], we do not have records today  Plan: 1.  We will request records from last gastroenterologist. 2.  Scheduled patient for a diagnostic colonoscopy in the [facility] due to positive Hemosure testing.  This is scheduled with Dr. Carlean Purl as he is supervising this morning.  Did discuss  risks, benefits, limitations and alternatives and patient agrees to proceed.  Patient will be Covid tested 2 days prior to time of procedure. 3.  Patient to follow in clinic per recommendations from Dr. Carlean Purl after time of procedure.  Ellouise Newer, PA-C [facility] 06/25/2019, 11:30 AM  Cc: Crist Infante, MD

## 2019-06-25 NOTE — Patient Instructions (Addendum)
If you are age 74 or older, your body mass index should be between 23-30. Your Body mass index is 24.78 kg/m. If this is out of the aforementioned range listed, please consider follow up with your Primary Care Provider.  If you are age 21 or younger, your body mass index should be between 19-25. Your Body mass index is 24.78 kg/m. If this is out of the aformentioned range listed, please consider follow up with your Primary Care Provider.   You have been scheduled for a colonoscopy. Please follow written instructions given to you at your visit today.  Please pick up your prep supplies at the pharmacy within the next 1-3 days. If you use inhalers (even only as needed), please bring them with you on the day of your procedure.

## 2019-06-26 LAB — SARS CORONAVIRUS 2 (TAT 6-24 HRS): SARS Coronavirus 2: NEGATIVE

## 2019-06-28 ENCOUNTER — Ambulatory Visit: Payer: Medicare Other

## 2019-06-29 ENCOUNTER — Other Ambulatory Visit: Payer: Self-pay

## 2019-06-29 ENCOUNTER — Encounter: Payer: Self-pay | Admitting: Internal Medicine

## 2019-06-29 ENCOUNTER — Ambulatory Visit (AMBULATORY_SURGERY_CENTER): Payer: Medicare Other | Admitting: Internal Medicine

## 2019-06-29 VITALS — BP 102/62 | HR 57 | Temp 97.3°F | Resp 25 | Ht 68.0 in | Wt 163.0 lb

## 2019-06-29 DIAGNOSIS — K5730 Diverticulosis of large intestine without perforation or abscess without bleeding: Secondary | ICD-10-CM

## 2019-06-29 DIAGNOSIS — R195 Other fecal abnormalities: Secondary | ICD-10-CM

## 2019-06-29 DIAGNOSIS — D125 Benign neoplasm of sigmoid colon: Secondary | ICD-10-CM | POA: Diagnosis not present

## 2019-06-29 DIAGNOSIS — Z86010 Personal history of colonic polyps: Secondary | ICD-10-CM | POA: Diagnosis not present

## 2019-06-29 MED ORDER — SODIUM CHLORIDE 0.9 % IV SOLN: 500.0000 mL | Freq: Once | INTRAVENOUS | Status: DC

## 2019-06-29 NOTE — Progress Notes (Signed)
Pt tolerated well. VSS. Awake and to recovery. 

## 2019-06-29 NOTE — Op Note (Signed)
[facility] Patient Name: Aaron Miller Procedure Date: 06/29/2019 2:13 PM MRN: [id number] Endoscopist: Gatha Mayer , MD Age: 74 Referring MD:  Date of Birth: [birth year] Gender: Male Account #: [id number] Procedure:                Colonoscopy Indications:              Positive fecal immunochemical test Medicines:                Propofol per Anesthesia, Monitored Anesthesia Care Procedure:                Pre-Anesthesia Assessment:                           - Prior to the procedure, a History and Physical                            was performed, and patient medications and                            allergies were reviewed. The patient's tolerance of                            previous anesthesia was also reviewed. The risks                            and benefits of the procedure and the sedation                            options and risks were discussed with the patient.                            All questions were answered, and informed consent                            was obtained. Prior Anticoagulants: The patient has                            taken no previous anticoagulant or antiplatelet                            agents. ASA Grade Assessment: II - A patient with                            mild systemic disease. After reviewing the risks                            and benefits, the patient was deemed in                            satisfactory condition to undergo the procedure.                           After obtaining informed consent, the colonoscope  was passed under direct vision. Throughout the                            procedure, the patient's blood pressure, pulse, and                            oxygen saturations were monitored continuously. The                            Colonoscope was introduced through the anus and                            advanced to the the cecum, identified by                            appendiceal  orifice and ileocecal valve. The                            colonoscopy was performed without difficulty. The                            patient tolerated the procedure well. The quality                            of the bowel preparation was good. The bowel                            preparation used was SUPREP via split dose                            instruction. The ileocecal valve, appendiceal                            orifice, and rectum were photographed. Scope In: 2:24:39 PM Scope Out: 2:37:48 PM Scope Withdrawal Time: 0 hours 10 minutes 55 seconds  Total Procedure Duration: 0 hours 13 minutes 9 seconds  Findings:                 The perianal and digital rectal examinations were                            normal. Pertinent negatives include normal prostate                            (size, shape, and consistency).                           Two sessile polyps were found in the sigmoid colon.                            The polyps were diminutive in size. These polyps                            were removed with a cold biopsy forceps. Resection  and retrieval were complete. Verification of                            patient identification for the specimen was done.                            Estimated blood loss was minimal.                           There was a small lipoma, in the cecum.                           Multiple diverticula were found in the sigmoid                            colon.                           Internal hemorrhoids were found.                           The exam was otherwise without abnormality on                            direct and retroflexion views. Complications:            No immediate complications. Estimated Blood Loss:     Estimated blood loss was minimal. Impression:               - Two diminutive polyps in the sigmoid colon,                            removed with a cold biopsy forceps. Resected and                             retrieved. possible cause hemosure + stool                           - Small lipoma in the cecum.                           - Diverticulosis in the sigmoid colon.                           - Internal hemorrhoids. possible cause hemosure +                            stool                           - The examination was otherwise normal on direct                            and retroflexion views. Recommendation:           - Patient has a contact number available for  emergencies. The signs and symptoms of potential                            delayed complications were discussed with the                            patient. Return to normal activities tomorrow.                            Written discharge instructions were provided to the                            patient.                           - Resume previous diet.                           - Continue present medications.                           - Await pathology results.                           - No recommendation at this time regarding repeat                            colonoscopy due to age. he may not need another                            routine colonoscopy and does not need future                            hemoccult testing - will let him know about                            colonoscopy Gatha Mayer, MD 06/29/2019 2:48:45 PM This report has been signed electronically.

## 2019-06-29 NOTE — Patient Instructions (Addendum)
I found and removed 2 tiny polyps that look benign but might be pre-cancerous. Also saw hemorrhoids and diverticulosis.  Either the polyps or hemorrhoids or both could have made the stool test show blood.  I will let you know pathology results and when/if to have another routine colonoscopy by mail and/or My Chart.  I appreciate the opportunity to care for you. Gatha Mayer, MD, FACG  YOU HAD AN ENDOSCOPIC PROCEDURE TODAY AT [facility]:   Refer to the procedure report that was given to you for any specific questions about what was found during the examination.  If the procedure report does not answer your questions, please call your gastroenterologist to clarify.  If you requested that your care partner not be given the details of your procedure findings, then the procedure report has been included in a sealed envelope for you to review at your convenience later.  YOU SHOULD EXPECT: Some feelings of bloating in the abdomen. Passage of more gas than usual.  Walking can help get rid of the air that was put into your GI tract during the procedure and reduce the bloating. If you had a lower endoscopy (such as a colonoscopy or flexible sigmoidoscopy) you may notice spotting of blood in your stool or on the toilet paper. If you underwent a bowel prep for your procedure, you may not have a normal bowel movement for a few days.  Please Note:  You might notice some irritation and congestion in your nose or some drainage.  This is from the oxygen used during your procedure.  There is no need for concern and it should clear up in a day or so.  SYMPTOMS TO REPORT IMMEDIATELY:   Following lower endoscopy (colonoscopy or flexible sigmoidoscopy):  Excessive amounts of blood in the stool  Significant tenderness or worsening of abdominal pains  Swelling of the abdomen that is new, acute  Fever of 100F or higher  For urgent or emergent issues, a gastroenterologist can be reached at any  hour by calling [phone].   DIET:  We do recommend a small meal at first, but then you may proceed to your regular diet.  Drink plenty of fluids but you should avoid alcoholic beverages for 24 hours.  ACTIVITY:  You should plan to take it easy for the rest of today and you should NOT DRIVE or use heavy machinery until tomorrow (because of the sedation medicines used during the test).    FOLLOW UP: Our staff will call the number listed on your records 48-72 hours following your procedure to check on you and address any questions or concerns that you may have regarding the information given to you following your procedure. If we do not reach you, we will leave a message.  We will attempt to reach you two times.  During this call, we will ask if you have developed any symptoms of COVID 19. If you develop any symptoms (ie: fever, flu-like symptoms, shortness of breath, cough etc.) before then, please call [phone].  If you test positive for Covid 19 in the 2 weeks post procedure, please call and report this information to [place].    If any biopsies were taken you will be contacted by phone or by letter within the next 1-3 weeks.  Please call us at [phone] if you have not heard about the biopsies in 3 weeks.    SIGNATURES/CONFIDENTIALITY: You and/or your care partner have signed paperwork which will be entered into your  electronic medical record.  These signatures attest to the fact that that the information above on your After Visit Summary has been reviewed and is understood.  Full responsibility of the confidentiality of this discharge information lies with you and/or your care-partner.

## 2019-07-01 ENCOUNTER — Telehealth: Payer: Self-pay

## 2019-07-01 NOTE — Telephone Encounter (Signed)
  Follow up Call-  Call back number 06/29/2019  Post procedure Call Back phone  # [phone]  Permission to leave phone message Yes     Unable to leave message

## 2019-07-01 NOTE — Telephone Encounter (Signed)
  Follow up Call-  Call back number 06/29/2019  Post procedure Call Back phone  # [phone]  Permission to leave phone message Yes     Patient questions:  Do you have a fever, pain , or abdominal swelling? No. Pain Score  0 *  Have you tolerated food without any problems? Yes.    Have you been able to return to your normal activities? Yes.    Do you have any questions about your discharge instructions: Diet   No. Medications  No. Follow up visit  No.  Do you have questions or concerns about your Care? no    Actions: * If pain score is 4 or above: No action needed, pain <4.  1. Have you developed a fever since your procedure? no  2.   Have you had an respiratory symptoms (SOB or cough) since your procedure? no  3.   Have you tested positive for COVID 19 since your procedure no  4.   Have you had any family members/close contacts diagnosed with the COVID 19 since your procedure?  no   If yes to any of these questions please route to Joylene John, RN and Alphonsa Gin, Therapist, sports.

## 2019-07-02 ENCOUNTER — Ambulatory Visit: Payer: Medicare Other

## 2019-07-02 NOTE — Telephone Encounter (Signed)
Charted in error.

## 2019-07-08 ENCOUNTER — Encounter: Payer: Self-pay | Admitting: Internal Medicine

## 2019-08-07 DIAGNOSIS — J449 Chronic obstructive pulmonary disease, unspecified: Secondary | ICD-10-CM | POA: Diagnosis not present

## 2019-08-12 ENCOUNTER — Other Ambulatory Visit: Payer: Self-pay | Admitting: Internal Medicine

## 2019-08-12 DIAGNOSIS — Z01812 Encounter for preprocedural laboratory examination: Secondary | ICD-10-CM

## 2019-08-12 DIAGNOSIS — Z79899 Other long term (current) drug therapy: Secondary | ICD-10-CM

## 2019-08-19 ENCOUNTER — Other Ambulatory Visit: Payer: Self-pay | Admitting: Neurology

## 2019-10-26 ENCOUNTER — Other Ambulatory Visit: Payer: Self-pay

## 2019-10-26 MED ORDER — REPATHA SURECLICK 140 MG/ML ~~LOC~~ SOAJ: 1.0000 | SUBCUTANEOUS | 5 refills | Status: DC

## 2019-12-02 ENCOUNTER — Ambulatory Visit: Payer: Medicare Other | Admitting: Internal Medicine

## 2020-01-06 DIAGNOSIS — E785 Hyperlipidemia, unspecified: Secondary | ICD-10-CM | POA: Diagnosis not present

## 2020-01-11 DIAGNOSIS — E785 Hyperlipidemia, unspecified: Secondary | ICD-10-CM | POA: Diagnosis not present

## 2020-01-11 DIAGNOSIS — M109 Gout, unspecified: Secondary | ICD-10-CM | POA: Diagnosis not present

## 2020-01-11 DIAGNOSIS — I10 Essential (primary) hypertension: Secondary | ICD-10-CM | POA: Diagnosis not present

## 2020-01-15 ENCOUNTER — Ambulatory Visit (INDEPENDENT_AMBULATORY_CARE_PROVIDER_SITE_OTHER): Payer: Medicare Other | Admitting: Internal Medicine

## 2020-01-15 ENCOUNTER — Encounter: Payer: Self-pay | Admitting: Internal Medicine

## 2020-01-15 ENCOUNTER — Other Ambulatory Visit: Payer: Self-pay

## 2020-01-15 VITALS — BP 150/86 | HR 61 | Ht 68.0 in | Wt 164.4 lb

## 2020-01-15 DIAGNOSIS — I712 Thoracic aortic aneurysm, without rupture, unspecified: Secondary | ICD-10-CM

## 2020-01-15 DIAGNOSIS — E782 Mixed hyperlipidemia: Secondary | ICD-10-CM

## 2020-01-15 DIAGNOSIS — I350 Nonrheumatic aortic (valve) stenosis: Secondary | ICD-10-CM

## 2020-01-15 DIAGNOSIS — Z01812 Encounter for preprocedural laboratory examination: Secondary | ICD-10-CM | POA: Diagnosis not present

## 2020-01-15 NOTE — Patient Instructions (Signed)
Medication Instructions:  Your physician recommends that you continue on your current medications as directed. Please refer to the Current Medication list given to you today.  *If you need a refill on your cardiac medications before your next appointment, please call your pharmacy*   Lab Work: BMET (prior to CT test) If you have labs (blood work) drawn today and your tests are completely normal, you will receive your results only by: Marland Kitchen MyChart Message (if you have MyChart) OR . A paper copy in the mail If you have any lab test that is abnormal or we need to change your treatment, we will call you to review the results.   Testing/Procedures: CT test @ [facility]    Follow-Up: At [facility], you and your health needs are our priority.  As part of our continuing mission to provide you with exceptional heart care, we have created designated Provider Care Teams.  These Care Teams include your primary Cardiologist (physician) and Advanced Practice Providers (APPs -  Physician Assistants and Nurse Practitioners) who all work together to provide you with the care you need, when you need it.  We recommend signing up for the patient portal called "MyChart".  Sign up information is provided on this After Visit Summary.  MyChart is used to connect with patients for Virtual Visits (Telemedicine).  Patients are able to view lab/test results, encounter notes, upcoming appointments, etc.  Non-urgent messages can be sent to your provider as well.   To learn more about what you can do with MyChart, go to [email or web address].    Your next appointment:   12 month(s)  The format for your next appointment:   In Person  Provider:   You may see Pixie Casino, MD or one of the following Advanced Practice Providers on your designated Care Team:    Almyra Deforest, PA-C  Fabian Sharp, PA-C or   Roby Lofts, [place]    Other Instructions

## 2020-01-15 NOTE — Progress Notes (Signed)
LIPID CLINIC CONSULT NOTE  Chief Complaint:  Follow-up dyslipidemia  Primary Care Physician: Crist Infante, MD  HPI:  Aaron Miller is a 74 y.o. male who is being seen today for the evaluation of CAC and dyslipidemia at the request of Crist Infante, MD.  This is a pleasant 74 year old male who was kindly referred to me by Dr. Haynes Kerns for evaluation of dyslipidemia, statin intolerance and multivessel coronary artery calcification.  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  Initially went to school in [place] at the [employer].  He has a past medical history significant for hypertension and dyslipidemia as well as smoking.  He underwent a screening CT scan for possible lung malignancy and was found to have a subcentimeter nodule.  This will require follow-up in February.  In addition he was noted to have multivessel coronary artery calcification, as well as an ectatic and dilated ascending aorta to 4.2 cm.  Unfortunately he has been statin intolerant.  Most recently was on rosuvastatin and had significant myalgias.  He was taken off of this and has a marked dyslipidemia.  As of March 27, 2018 total cholesterol is 194, triglycerides 228, HDL 47 LDL 101.  He was started on Repatha per his primary care provider and has had one injection.  He was referred I presume for cardiovascular evaluation given his findings of multivessel coronary artery calcification and mild aortic stenosis.  Aaron Miller reports he is quite physically active.  He says he is asymptomatic with exercise and activity.  He denies any chest pain or shortness of breath.  He said he did have stress testing in February this year in [place] that was ordered by his primary care provider which was apparently negative.  01/15/2020  Aaron Miller returns today for follow-up  of dyslipidemia.  He is tolerating Repatha well without any significant side effects.  This has been quite successful with a marked reduction in his lipids and total cholesterol 109, triglycerides 167, HDL 42 and LDL of 34.  Overall marked improvement in all categories of his lipids.  He is very pleased with those results.  EKG was performed today showed sinus rhythm at 61.  PMHx:  Past Medical History:  Diagnosis Date  . Adenomatous polyp of colon 06/2019   diminutive - no routine repeat needed  . Hyperlipidemia   . Hypertension     Past Surgical History:  Procedure Laterality Date  . COLONOSCOPY      FAMHx:  Family History  Problem Relation Age of Onset  . Lung cancer Mother   . Hypertension Father   . Diabetes Father   . Lung cancer Maternal Grandmother   . Colon cancer Neg Hx   . Colon polyps Neg Hx   . Pancreatic cancer Neg Hx   . Stomach cancer Neg Hx   . Esophageal cancer Neg Hx     SOCHx:   reports that he quit smoking about 4 years ago. His smoking use included cigarettes. He has never used smokeless tobacco. He reports current alcohol use. He reports that he does not use drugs.  ALLERGIES:  Allergies  Allergen Reactions  . Statins Other (See Comments)    Myalgias - Crestor, pravastatin    ROS: Pertinent items noted in HPI and remainder of comprehensive ROS otherwise negative.  HOME MEDS: Current Outpatient Medications on File  Prior to Visit  Medication Sig Dispense Refill  . allopurinol (ZYLOPRIM) 300 MG tablet Take 300 mg by mouth daily.    Marland Kitchen aspirin 81 MG tablet Take 81 mg by mouth daily.    . Evolocumab (REPATHA SURECLICK) 536 MG/ML SOAJ Inject 1 Dose into the skin every 14 (fourteen) days. 2 pen 5  . mirtazapine (REMERON) 15 MG tablet Take 15 mg by mouth at bedtime.    Marland Kitchen telmisartan (MICARDIS) 20 MG tablet Take 20 mg by mouth daily.    . indomethacin (INDOCIN) 50 MG capsule Take 50 mg by mouth 2 (two) times daily. (Patient not taking: Reported on  01/15/2020)     No current facility-administered medications on file prior to visit.    LABS/IMAGING: No results found for this or any previous visit (from the past 48 hour(s)). No results found.  LIPID PANEL: No results found for: CHOL, TRIG, HDL, CHOLHDL, VLDL, LDLCALC, LDLDIRECT  WEIGHTS: Wt Readings from Last 3 Encounters:  01/15/20 164 lb 6.4 oz (74.6 kg)  06/29/19 163 lb (73.9 kg)  06/25/19 163 lb (73.9 kg)    VITALS: BP (!) 150/86   Pulse 61   Ht 5\' 8"  (1.727 m)   Wt 164 lb 6.4 oz (74.6 kg)   SpO2 98%   BMI 25.00 kg/m   EXAM: General appearance: alert and no distress Neck: no carotid bruit, no JVD and thyroid not enlarged, symmetric, no tenderness/mass/nodules Lungs: clear to auscultation bilaterally Heart: regular rate and rhythm, S1, S2 normal and systolic murmur: early systolic 2/6, crescendo at 2nd right intercostal space Abdomen: soft, non-tender; bowel sounds normal; no masses,  no organomegaly Extremities: extremities normal, atraumatic, no cyanosis or edema Pulses: 2+ and symmetric Skin: Skin color, texture, turgor normal. No rashes or lesions Neurologic: Grossly normal Psych: Pleasant  EKG: Normal sinus rhythm at 61, moderate voltage criteria for LVH-personally reviewed  ASSESSMENT: 1. Marked dyslipidemia 2. Statin intolerance 3. Multivessel coronary artery calcification 4. Dilated aorta-4.4 cm (04/2019) 5. Mild aortic stenosis  PLAN: 1.   Aaron Miller has had a significant reduction in his lipids with total cholesterol of 109 and LDL of 34 at this point.  He is tolerating Repatha without any significant side effects.  There really is not anything to back off of as far as other medications.  Blood pressure was a little elevated today.  He should continue to monitor this at home and is only on low-dose telmisartan 20 mg.  Angiogram in December 2020 showed a dilated ascending aorta and a repeat scan is ordered for December 2021.  We will follow-up with him  on that.  Pixie Casino, MD, [facility], [place] Director of the Advanced Lipid Disorders &  Cardiovascular Risk Reduction Clinic Diplomate of the American Board of Clinical Lipidology Attending Cardiologist  Direct Dial: [phone]  Fax: [phone]  Website:  [email or web address] 01/15/2020, 11:03 AM

## 2020-01-16 ENCOUNTER — Encounter: Payer: Self-pay | Admitting: Internal Medicine

## 2020-01-28 ENCOUNTER — Ambulatory Visit
Admission: RE | Admit: 2020-01-28 | Discharge: 2020-01-28 | Disposition: A | Discharge status: Home / Self Care | Payer: Medicare Other | Source: Ambulatory Visit | Attending: Internal Medicine | Admitting: Internal Medicine

## 2020-01-28 ENCOUNTER — Other Ambulatory Visit: Payer: Self-pay

## 2020-01-28 DIAGNOSIS — I7121 Aneurysm of the ascending aorta, without rupture: Secondary | ICD-10-CM

## 2020-01-28 DIAGNOSIS — I700 Atherosclerosis of aorta: Secondary | ICD-10-CM | POA: Diagnosis not present

## 2020-01-28 MED ORDER — IOPAMIDOL (ISOVUE-370) INJECTION 76%
60.0000 mL | Freq: Once | INTRAVENOUS | Status: AC | PRN
  Administered 2020-01-28: 60 mL via INTRAVENOUS

## 2020-02-01 ENCOUNTER — Other Ambulatory Visit: Payer: Self-pay | Admitting: *Deleted

## 2020-02-01 DIAGNOSIS — R911 Solitary pulmonary nodule: Secondary | ICD-10-CM

## 2020-02-01 DIAGNOSIS — I712 Thoracic aortic aneurysm, without rupture, unspecified: Secondary | ICD-10-CM

## 2020-02-27 DIAGNOSIS — Z23 Encounter for immunization: Secondary | ICD-10-CM | POA: Diagnosis not present

## 2020-03-03 DIAGNOSIS — M109 Gout, unspecified: Secondary | ICD-10-CM | POA: Diagnosis not present

## 2020-03-26 DIAGNOSIS — Z23 Encounter for immunization: Secondary | ICD-10-CM | POA: Diagnosis not present

## 2020-04-21 ENCOUNTER — Other Ambulatory Visit: Payer: Self-pay | Admitting: Internal Medicine

## 2020-04-26 DIAGNOSIS — M109 Gout, unspecified: Secondary | ICD-10-CM | POA: Diagnosis not present

## 2020-04-26 DIAGNOSIS — E785 Hyperlipidemia, unspecified: Secondary | ICD-10-CM | POA: Diagnosis not present

## 2020-04-26 DIAGNOSIS — E291 Testicular hypofunction: Secondary | ICD-10-CM | POA: Diagnosis not present

## 2020-04-26 DIAGNOSIS — Z125 Encounter for screening for malignant neoplasm of prostate: Secondary | ICD-10-CM | POA: Diagnosis not present

## 2020-05-03 DIAGNOSIS — I350 Nonrheumatic aortic (valve) stenosis: Secondary | ICD-10-CM | POA: Diagnosis not present

## 2020-05-03 DIAGNOSIS — J449 Chronic obstructive pulmonary disease, unspecified: Secondary | ICD-10-CM | POA: Diagnosis not present

## 2020-05-03 DIAGNOSIS — I10 Essential (primary) hypertension: Secondary | ICD-10-CM | POA: Diagnosis not present

## 2020-05-03 DIAGNOSIS — G2581 Restless legs syndrome: Secondary | ICD-10-CM | POA: Diagnosis not present

## 2020-05-03 DIAGNOSIS — Z0000 Encounter for general adult medical examination without abnormal findings: Secondary | ICD-10-CM | POA: Diagnosis not present

## 2020-05-03 DIAGNOSIS — E8809 Other disorders of plasma-protein metabolism, not elsewhere classified: Secondary | ICD-10-CM | POA: Diagnosis not present

## 2020-05-03 DIAGNOSIS — N1831 Chronic kidney disease, stage 3a: Secondary | ICD-10-CM | POA: Diagnosis not present

## 2020-05-03 DIAGNOSIS — R911 Solitary pulmonary nodule: Secondary | ICD-10-CM | POA: Diagnosis not present

## 2020-05-03 DIAGNOSIS — R7301 Impaired fasting glucose: Secondary | ICD-10-CM | POA: Diagnosis not present

## 2020-05-03 DIAGNOSIS — E785 Hyperlipidemia, unspecified: Secondary | ICD-10-CM | POA: Diagnosis not present

## 2020-05-03 DIAGNOSIS — R82998 Other abnormal findings in urine: Secondary | ICD-10-CM | POA: Diagnosis not present

## 2020-05-03 DIAGNOSIS — I700 Atherosclerosis of aorta: Secondary | ICD-10-CM | POA: Diagnosis not present

## 2020-05-03 DIAGNOSIS — I2510 Atherosclerotic heart disease of native coronary artery without angina pectoris: Secondary | ICD-10-CM | POA: Diagnosis not present

## 2020-05-03 DIAGNOSIS — K921 Melena: Secondary | ICD-10-CM | POA: Diagnosis not present

## 2020-05-04 ENCOUNTER — Other Ambulatory Visit: Payer: Self-pay | Admitting: Internal Medicine

## 2020-05-04 DIAGNOSIS — R911 Solitary pulmonary nodule: Secondary | ICD-10-CM

## 2020-05-18 ENCOUNTER — Telehealth: Payer: Self-pay | Admitting: Internal Medicine

## 2020-05-18 NOTE — Telephone Encounter (Signed)
Patient called - aware to remain on ASA 81mg  daily

## 2020-05-18 NOTE — Telephone Encounter (Signed)
He absolutely should stay on aspirin- he does have multivessel coronary calcium, so this is secondary prevention from heart attack for him.  Dr Rexene Edison

## 2020-05-18 NOTE — Telephone Encounter (Signed)
Patient sent letter to MD dated 05/07/2020 Letter states: "I had my annual physical with Dr. Waynard Edwards last week. We discussed whether or not I should continue taking aspirin 81. Since you are my heart doctor, he said he will leave that decision to you. I have been on the aspirin for 15+ years,. Please let me know what you recommend."  Routed to Dr. Rennis Golden to advise  Phone: [phone]

## 2020-05-20 IMAGING — CT CT CHEST W/O CM
1 series · 15 of 34 positions shown, 19 images · IV contrast (iopamidol)
Comparison: CT chest, 03/03/2018

CLINICAL DATA: Follow-up lung nodule, weight loss

EXAM:
CT CHEST, ABDOMEN, AND PELVIS WITH CONTRAST
TECHNIQUE: Multidetector CT imaging of the chest without intravenous contrast.
Multidetector CT imaging of the abdomen and pelvis was performed
following the standard protocol during bolus administration of
intravenous contrast.
CONTRAST:  100mL 7URP3Q-NXX IOPAMIDOL ([id number]) INJECTION 61%.
Additional oral enteric contrast

[Series 2: chest w/(date) · axial · 0.77mm/px · z∈[-273,+13]mm · 15 of 169 slices shown, 19 images]
[im 13/169  mediastinal]
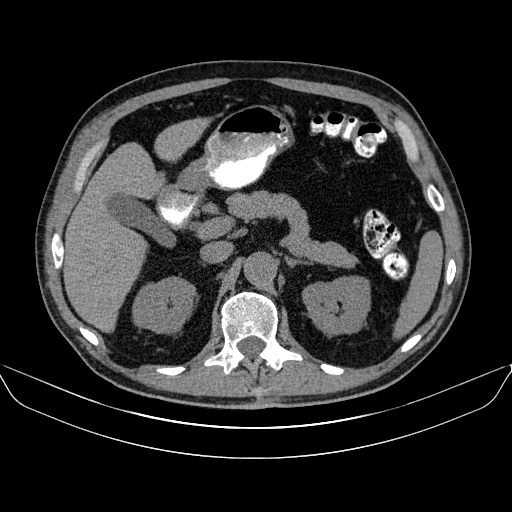
[im 13/169  lung]
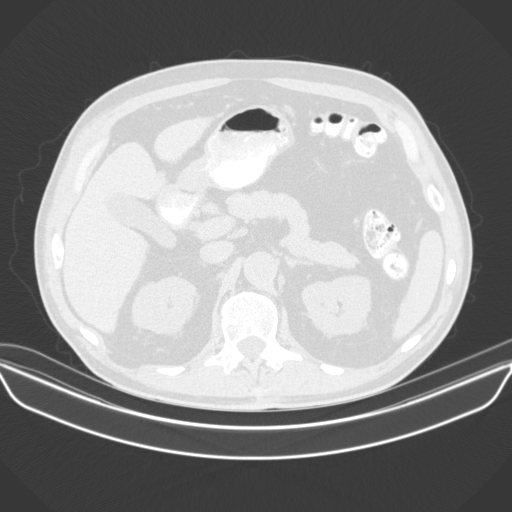
[im 25/169  lung]
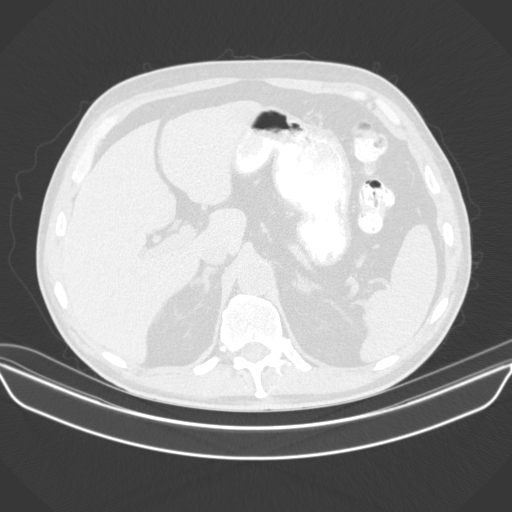
[im 34/169  lung]
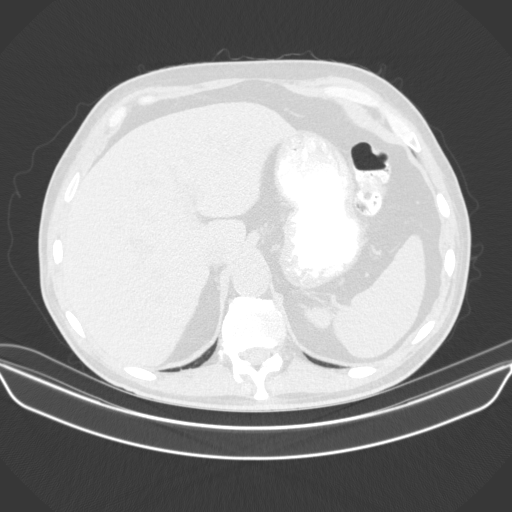
[im 44/169  lung]
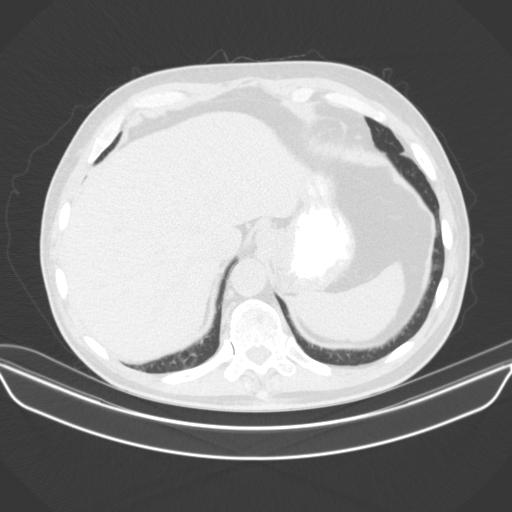
[im 57/169  mediastinal]
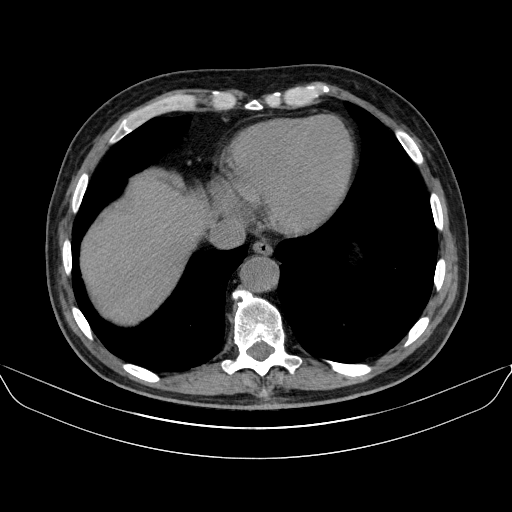
[im 57/169  lung]
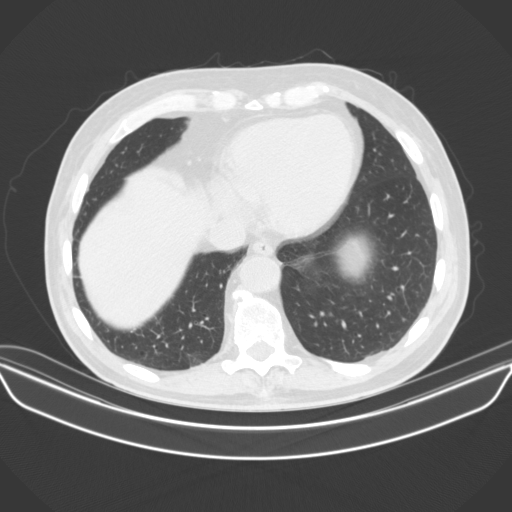
[im 68/169  lung]
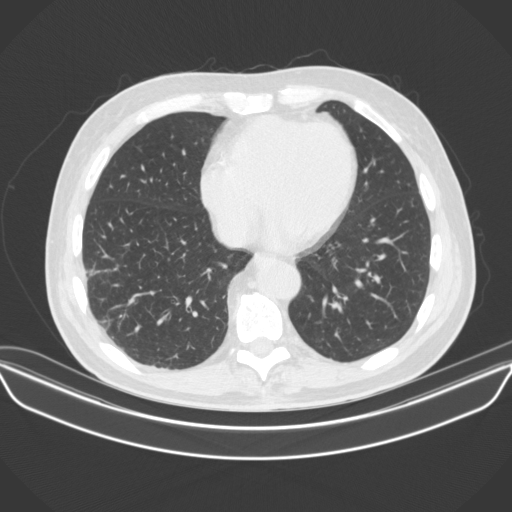
[im 75/169  lung]
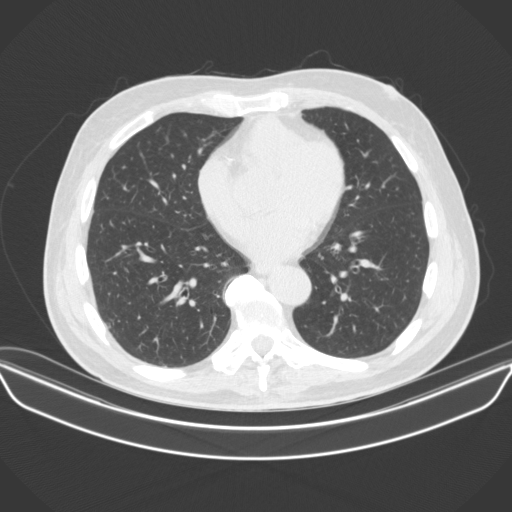
[im 88/169  lung]
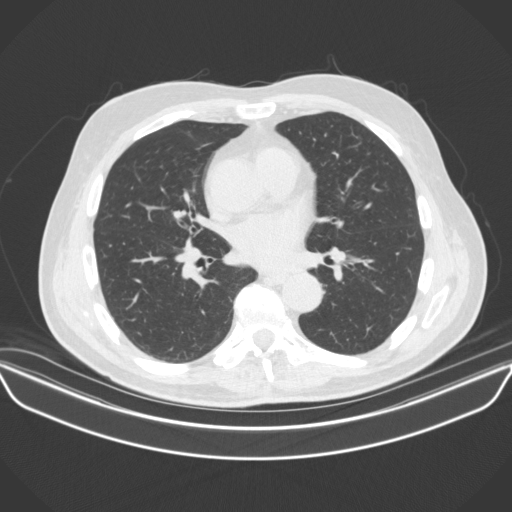
[im 94/169  mediastinal]
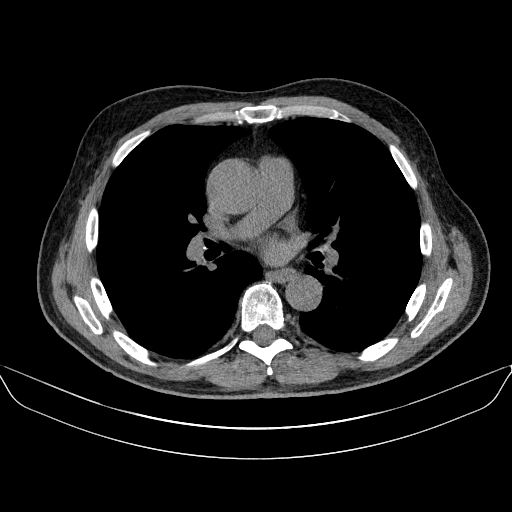
[im 94/169  lung]
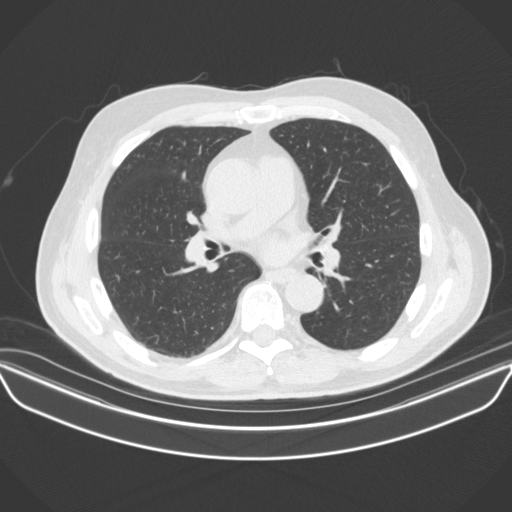
[im 101/169  lung]
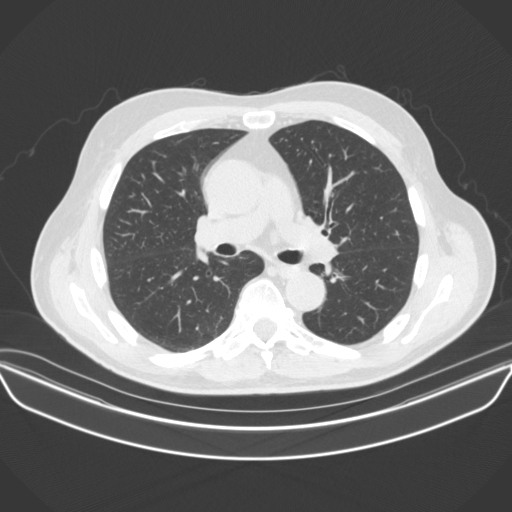
[im 113/169  lung]
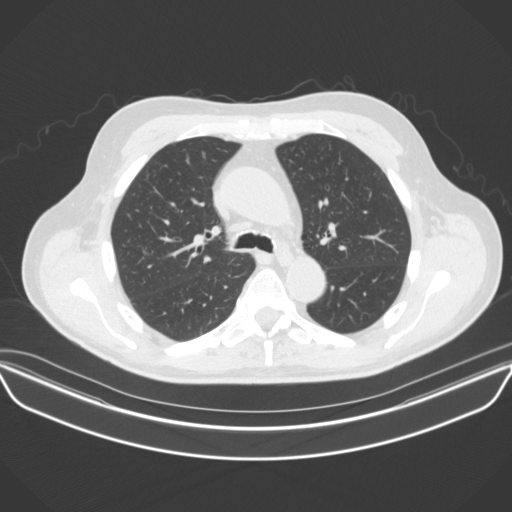
[im 125/169  lung]
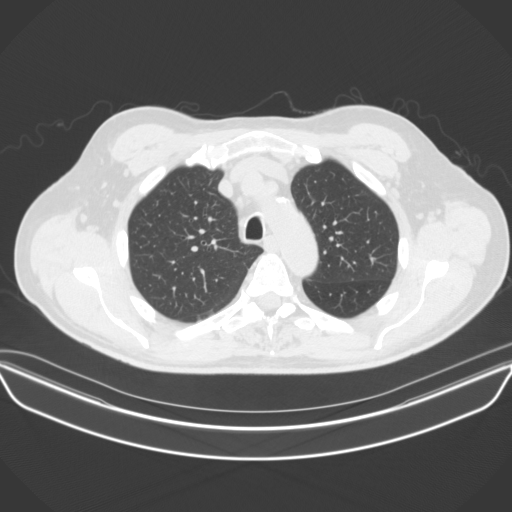
[im 135/169  mediastinal]
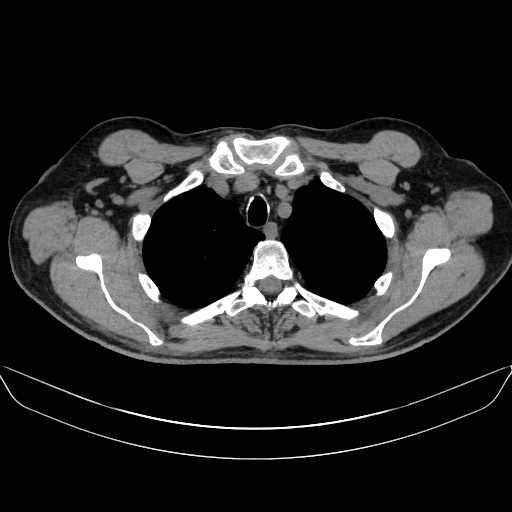
[im 135/169  lung]
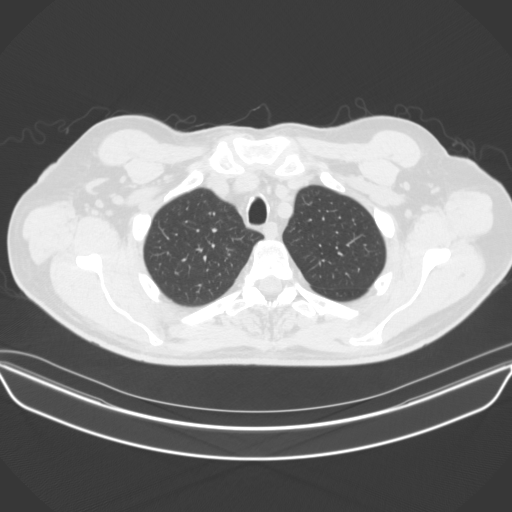
[im 144/169  lung]
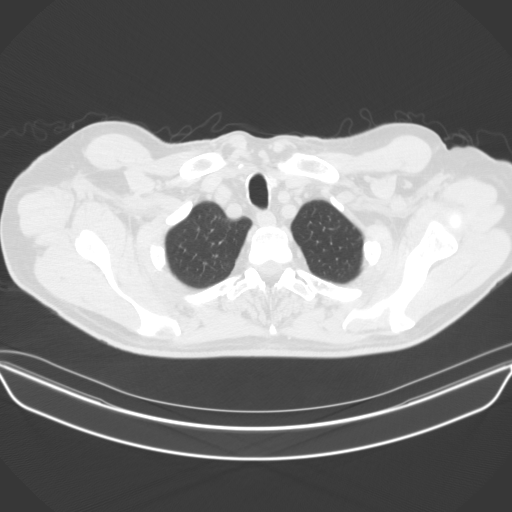
[im 156/169  lung]
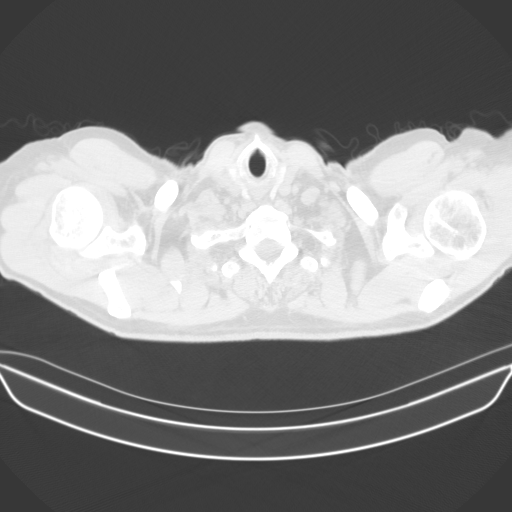

[15 of 34 positions shown; findings below may reference images not displayed]

FINDINGS: CT CHEST FINDINGS

Cardiovascular: Coronary artery calcifications. Normal heart size.
No pericardial effusion. Stable enlargement of the tubular thoracic
aorta measuring 4.3 cm.

Mediastinum/Nodes: No enlarged mediastinal, hilar, or axillary lymph
nodes. Thyroid gland, trachea, and esophagus demonstrate no
significant findings.

Lungs/Pleura: Unchanged irregular opacities of the right lung base
and occasional tiny pulmonary nodules. No pleural effusion or
pneumothorax.

Musculoskeletal: No chest wall mass or suspicious bone lesions
identified.

CT ABDOMEN PELVIS FINDINGS

Hepatobiliary: No focal liver abnormality is seen. No gallstones,
gallbladder wall thickening, or biliary dilatation.

Pancreas: Unremarkable. No pancreatic ductal dilatation or
surrounding inflammatory changes.

Spleen: Normal in size without focal abnormality.

Adrenals/Urinary Tract: Adrenal glands are unremarkable. Kidneys are
normal, without renal calculi, focal lesion, or hydronephrosis.
Bladder is unremarkable.

Stomach/Bowel: Stomach is within normal limits. Appendix appears
normal. No evidence of bowel wall thickening, distention, or
inflammatory changes.

Vascular/Lymphatic: Mixed calcific atherosclerosis. No enlarged
abdominal or pelvic lymph nodes.

Reproductive: No mass or other abnormality.

Other: No abdominal wall hernia or abnormality. No abdominopelvic
ascites.

Musculoskeletal: No acute or significant osseous findings.
IMPRESSION: 1. No CT findings in the chest, abdomen, or pelvis to explain weight
loss.

2. Stable irregular opacities of the right lung base and occasional
tiny pulmonary nodules. Recommend return to annual CT screening.

3. Chronic and incidental findings as detailed above, including
coronary artery disease.

## 2020-05-31 ENCOUNTER — Ambulatory Visit
Admission: RE | Admit: 2020-05-31 | Discharge: 2020-05-31 | Disposition: A | Discharge status: Home / Self Care | Payer: Medicare Other | Source: Ambulatory Visit | Attending: Internal Medicine | Admitting: Internal Medicine

## 2020-05-31 DIAGNOSIS — R911 Solitary pulmonary nodule: Secondary | ICD-10-CM

## 2020-09-07 ENCOUNTER — Other Ambulatory Visit: Payer: Self-pay

## 2020-09-07 MED ORDER — REPATHA SURECLICK 140 MG/ML ~~LOC~~ SOAJ: 1.0000 | SUBCUTANEOUS | 11 refills | Status: DC

## 2020-10-27 DIAGNOSIS — G629 Polyneuropathy, unspecified: Secondary | ICD-10-CM | POA: Diagnosis not present

## 2020-10-27 DIAGNOSIS — I8310 Varicose veins of unspecified lower extremity with inflammation: Secondary | ICD-10-CM | POA: Diagnosis not present

## 2020-10-27 DIAGNOSIS — G2581 Restless legs syndrome: Secondary | ICD-10-CM | POA: Diagnosis not present

## 2020-10-27 DIAGNOSIS — R7301 Impaired fasting glucose: Secondary | ICD-10-CM | POA: Diagnosis not present

## 2020-10-27 DIAGNOSIS — I10 Essential (primary) hypertension: Secondary | ICD-10-CM | POA: Diagnosis not present

## 2020-11-24 DIAGNOSIS — I10 Essential (primary) hypertension: Secondary | ICD-10-CM | POA: Diagnosis not present

## 2020-11-24 DIAGNOSIS — G629 Polyneuropathy, unspecified: Secondary | ICD-10-CM | POA: Diagnosis not present

## 2020-12-03 DIAGNOSIS — Z23 Encounter for immunization: Secondary | ICD-10-CM | POA: Diagnosis not present

## 2021-02-21 ENCOUNTER — Other Ambulatory Visit (HOSPITAL_BASED_OUTPATIENT_CLINIC_OR_DEPARTMENT_OTHER): Payer: Self-pay

## 2021-02-21 ENCOUNTER — Other Ambulatory Visit: Payer: Self-pay

## 2021-02-21 ENCOUNTER — Ambulatory Visit: Payer: Medicare Other | Attending: Internal Medicine

## 2021-02-21 DIAGNOSIS — Z23 Encounter for immunization: Secondary | ICD-10-CM

## 2021-02-21 MED ORDER — MODERNA COVID-19 BIVAL BOOSTER 50 MCG/0.5ML IM SUSP
INTRAMUSCULAR | 0 refills | Status: DC
Start: 2021-02-21 — End: 2023-08-05
  Filled 2021-02-21: qty 0.5, 1d supply, fill #0

## 2021-02-21 MED ORDER — FLUAD QUADRIVALENT 0.5 ML IM PRSY
PREFILLED_SYRINGE | INTRAMUSCULAR | 0 refills | Status: DC
Start: 2021-02-21 — End: 2023-08-05
  Filled 2021-02-21: qty 0.5, 1d supply, fill #0

## 2021-02-21 NOTE — Progress Notes (Signed)
   Covid-19 Vaccination Clinic  Name:  [name]    MRN: [id number] DOB: 1945-11-06  02/21/2021  Aaron Miller was observed post Covid-19 immunization for 15 minutes without incident. He was provided with Vaccine Information Sheet and instruction to access the V-Safe system.   Aaron Miller was instructed to call 911 with any severe reactions post vaccine: Difficulty breathing  Swelling of face and throat  A fast heartbeat  A bad rash all over body  Dizziness and weakness

## 2021-02-23 DIAGNOSIS — Z23 Encounter for immunization: Secondary | ICD-10-CM | POA: Diagnosis not present

## 2021-05-18 ENCOUNTER — Telehealth: Payer: Self-pay | Admitting: Internal Medicine

## 2021-05-18 NOTE — Telephone Encounter (Signed)
PA for repatha submitted via CMM (Key: [id number]) TELMRA:[id number];DHDIXB:[id number];Review Type:Prior Auth;Coverage Start Date:04/18/2021;Coverage End Date:05/18/2022

## 2021-05-24 NOTE — Progress Notes (Signed)
Cardiology Office Note:    Date:  05/25/2021   ID:  Aaron Miller, DOB 11-29-1945, MRN [id number]  PCP:  Crist Infante, [place] Cardiologist: Pixie Casino, MD   Reason for visit: 1 year follow-up  History of Present Illness:    Aaron Miller is a 76 y.o. male with a hx of dyslipidemia, statin intolerance, multivessel coronary artery calcification, hypertension, tobacco use (quit 8 years ago), dilated ascending aorta.  He last saw Dr. Debara Pickett in August 2021.  Patient's cholesterol had improved with Repatha and blood pressure was noted to be slightly elevated.  Today, the patient is doing well.  He denies chest pain, shortness of breath, palpitations, lightheadedness, syncope and bleeding issues.  He has insurance coverage on his [place].  He requests blood work with upcoming annual physical next month.     Past Medical History:  Diagnosis Date   Adenomatous polyp of colon 06/2019   diminutive - no routine repeat needed   Hyperlipidemia    Hypertension     Past Surgical History:  Procedure Laterality Date   COLONOSCOPY      Current Medications: Current Meds  Medication Sig   allopurinol (ZYLOPRIM) 300 MG tablet Take 300 mg by mouth daily.   aspirin 81 MG tablet Take 81 mg by mouth daily.   Evolocumab (REPATHA SURECLICK) 188 MG/ML SOAJ Inject 1 Dose into the skin every 14 (fourteen) days.   mirtazapine (REMERON) 15 MG tablet Take 15 mg by mouth at bedtime.   telmisartan (MICARDIS) 20 MG tablet Take 20 mg by mouth daily.     Allergies:   Statins   Social History   Socioeconomic History   Marital status: Married    Spouse name: Not on file   Number of children: Not on file   Years of education: Not on file   Highest education level: Not on file  Occupational History   Not on file  Tobacco Use   Smoking status: Former    Types: Cigarettes    Quit date: 2017    Years since quitting: 6.0   Smokeless tobacco: Never  Vaping Use   Vaping Use:  Never used  Substance and Sexual Activity   Alcohol use: Yes    Comment: 2 glasses of wine daily   Drug use: Never   Sexual activity: Not on file  Other Topics Concern   Not on file  Social History Narrative   Married.  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  Masters degree   Social Determinants of Radio broadcast assistant Strain: Not on file  Food Insecurity: Not on file  Transportation Needs: Not on file  Physical Activity: Not on file  Stress: Not on file  Social Connections: Not on file     Family History: The patient's family history includes Diabetes in his father; Hypertension in his father; Lung cancer in his maternal grandmother and mother. There is no history of Colon cancer, Colon polyps, Pancreatic cancer, Stomach cancer, or Esophageal cancer.  ROS:   Please see the history of present illness.     EKGs/Labs/Other Studies Reviewed:    EKG:  The ekg ordered today demonstrates sinus rhythm with a premature supraventricular complex.  Heart rate 67, PR interval 160 ms, QRS duration 76 ms.  Recent Labs: No results found for requested labs within last 8760 hours.   Recent Lipid Panel No results found for: CHOL, TRIG, HDL, LDLCALC, LDLDIRECT  Physical Exam:    VS:  BP 122/70 (BP Location: Left Arm)    Pulse 67    Ht 5\' 8"  (1.727 m)    Wt 162 lb 12.8 oz (73.8 kg)    SpO2 98%    BMI 24.75 kg/m    No data found.  Wt Readings from Last 3 Encounters:  05/25/21 162 lb 12.8 oz (73.8 kg)  01/15/20 164 lb 6.4 oz (74.6 kg)  06/29/19 163 lb (73.9 kg)     GEN:  Well nourished, well developed in no acute distress HEENT: Normal NECK: No JVD; No carotid bruits CARDIAC: RRR, no murmurs, rubs, gallops RESPIRATORY:  Clear to auscultation without rales, wheezing or rhonchi  ABDOMEN: Soft, non-tender, non-distended MUSCULOSKELETAL: No edema; No deformity  SKIN: Warm and dry NEUROLOGIC:  Alert and oriented PSYCHIATRIC:  Normal affect      ASSESSMENT AND PLAN   Multivessel coronary artery calcification -No angina -Continue Repatha and aspirin.  Hyperlipidemia -LDL 23 in 2021.  Recheck lipids.  Continue Repatha.  Hypertension, controlled. -Continue Micardis.  Check metabolic panel today. -Goal BP is <120.  Thoracic aortic aneurysm -2D echo 06/2018: EF 55 to 60%, mild LVH, diastolic dysfunction, mild dilated ascending aorta at 43 mm  -CT of the chest January 2022 with stable 4.2cm ascending thoracic aortic aneurysm --> recommended to have annual imaging with CTA/MRA - ordered -goal SBP: 105-120.  Disposition - Follow-up in 1 year with Dr. Debara Pickett.        Medication Adjustments/Labs and Tests Ordered: Current medicines are reviewed at length with the patient today.  Concerns regarding medicines are outlined above.  Orders Placed This Encounter  Procedures   CT ANGIO CHEST AORTA W/CM & OR WO/CM   Comprehensive metabolic panel   Lipid panel   CBC   EKG 12-Lead   No orders of the defined types were placed in this encounter.   Patient Instructions  Medication Instructions:  No Changes *If you need a refill on your cardiac medications before your next appointment, please call your pharmacy*   Lab Work: CBC,CMET,Lipid Panel,. Today If you have labs (blood work) drawn today and your tests are completely normal, you will receive your results only by: [place] (if you have MyChart) OR A paper copy in the mail If you have any lab test that is abnormal or we need to change your treatment, we will call you to review the results.   Testing/Procedures: Your physician has requested that you have cardiac CT. Cardiac computed tomography (CT) is a painless test that uses an x-ray machine to take clear, detailed pictures of your heart. For further information please visit [email or web address]. Please follow instruction sheet as given.     Follow-Up: At [facility], you and your health needs are our priority.   As part of our continuing mission to provide you with exceptional heart care, we have created designated Provider Care Teams.  These Care Teams include your primary Cardiologist (physician) and Advanced Practice Providers (APPs -  Physician Assistants and Nurse Practitioners) who all work together to provide you with the care you need, when you need it.  We recommend signing up for the patient portal called "MyChart".  Sign up information is provided on this After Visit Summary.  MyChart is used to connect with patients for Virtual Visits (Telemedicine).  Patients are able to view lab/test results, encounter notes, upcoming appointments, etc.  Non-urgent messages can be sent to your provider as well.   To learn more about  what you can do with MyChart, go to [email or web address].    Your next appointment:   1 year(s)  The format for your next appointment:   In Person  Provider:   Pixie Casino, MD     Other Instructions   Your cardiac CT will be scheduled at one of the below locations:   [facility] [address] [place], [place] [address] [phone]    If scheduled at [facility], please arrive at the [facility] main entrance (entrance A) of [facility] 30 minutes prior to test start time. You can use the FREE valet parking offered at the main entrance (encouraged to control the heart rate for the test) Proceed to the [facility] Radiology Department (first floor) to check-in and test prep.  If scheduled at [facility], please arrive 15 mins early for check-in and test prep.  Please follow these instructions carefully (unless otherwise directed):  Hold all erectile dysfunction medications at least 3 days (72 hrs) prior to test.  On the Night Before the Test: Be sure to Drink plenty of water. Do not consume any caffeinated/decaffeinated beverages or chocolate 12 hours prior to your test. Do not take any  antihistamines 12 hours prior to your test. If the patient has contrast allergy: Patient will need a prescription for Prednisone and very clear instructions (as follows): Prednisone 50 mg - take 13 hours prior to test Take another Prednisone 50 mg 7 hours prior to test Take another Prednisone 50 mg 1 hour prior to test Take Benadryl 50 mg 1 hour prior to test Patient must complete all four doses of above prophylactic medications. Patient will need a ride after test due to Benadryl.  On the Day of the Test: Drink plenty of water until 1 hour prior to the test. Do not eat any food 4 hours prior to the test. You may take your regular medications prior to the test.  Take metoprolol (Lopressor) two hours prior to test. HOLD Furosemide/Hydrochlorothiazide morning of the test. FEMALES- please wear underwire-free bra if available, avoid dresses & tight clothing   *For Clinical Staff only. Please instruct patient the following:* Heart Rate Medication Recommendations for Cardiac CT  Resting HR < 50 bpm  No medication  Resting HR 50-60 bpm and BP >110/50 mmHG   Consider Metoprolol tartrate 25 mg PO 90-120 min prior to scan  Resting HR 60-65 bpm and BP >110/50 mmHG  Metoprolol tartrate 50 mg PO 90-120 minutes prior to scan   Resting HR > 65 bpm and BP >110/50 mmHG  Metoprolol tartrate 100 mg PO 90-120 minutes prior to scan  Consider Ivabradine 10-15 mg PO or a calcium channel blocker for resting HR >60 bpm and contraindication to metoprolol tartrate  Consider Ivabradine 10-15 mg PO in combination with metoprolol tartrate for HR >80 bpm         After the Test: Drink plenty of water. After receiving IV contrast, you may experience a mild flushed feeling. This is normal. On occasion, you may experience a mild rash up to 24 hours after the test. This is not dangerous. If this occurs, you can take Benadryl 25 mg and increase your fluid intake. If you experience trouble breathing, this can be  serious. If it is severe call 911 IMMEDIATELY. If it is mild, please call our office. If you take any of these medications: Glipizide/Metformin, Avandament, Glucavance, please do not take 48 hours after completing test unless otherwise instructed.  Please allow 2-4 weeks for scheduling of routine cardiac CTs. Some insurance companies require a pre-authorization which may delay scheduling of this test.   For non-scheduling related questions, please contact the cardiac imaging nurse navigator should you have any questions/concerns: Marchia Bond, Cardiac Imaging Nurse Navigator Gordy Clement, Cardiac Imaging Nurse Navigator [facility] Direct Office Dial: [phone]   For scheduling needs, including cancellations and rescheduling, please call [place], [phone].    Signed, Warren Lacy, PA-C  05/25/2021 8:43 AM    [facility]

## 2021-05-25 ENCOUNTER — Encounter: Payer: Self-pay | Admitting: Physician Assistant

## 2021-05-25 ENCOUNTER — Ambulatory Visit (INDEPENDENT_AMBULATORY_CARE_PROVIDER_SITE_OTHER): Payer: Medicare Other | Admitting: Physician Assistant

## 2021-05-25 ENCOUNTER — Other Ambulatory Visit: Payer: Self-pay

## 2021-05-25 VITALS — BP 122/70 | HR 67 | Ht 68.0 in | Wt 162.8 lb

## 2021-05-25 DIAGNOSIS — I2510 Atherosclerotic heart disease of native coronary artery without angina pectoris: Secondary | ICD-10-CM

## 2021-05-25 DIAGNOSIS — I10 Essential (primary) hypertension: Secondary | ICD-10-CM

## 2021-05-25 DIAGNOSIS — E782 Mixed hyperlipidemia: Secondary | ICD-10-CM

## 2021-05-25 DIAGNOSIS — I7121 Aneurysm of the ascending aorta, without rupture: Secondary | ICD-10-CM

## 2021-05-25 LAB — CBC
Hematocrit: 43.2 % (ref 37.5–51.0)
Hemoglobin: 14.8 g/dL (ref 13.0–17.7)
MCH: 33.4 pg — ABNORMAL HIGH (ref 26.6–33.0)
MCHC: 34.3 g/dL (ref 31.5–35.7)
MCV: 98 fL — ABNORMAL HIGH (ref 79–97)
Platelets: 274 10*3/uL (ref 150–450)
RBC: 4.43 x10E6/uL (ref 4.14–5.80)
RDW: 11.7 % (ref 11.6–15.4)
WBC: 8.5 10*3/uL (ref 3.4–10.8)

## 2021-05-25 LAB — COMPREHENSIVE METABOLIC PANEL
ALT: 18 IU/L (ref 0–44)
AST: 19 IU/L (ref 0–40)
Albumin/Globulin Ratio: 1.8 (ref 1.2–2.2)
Albumin: 4.8 g/dL — ABNORMAL HIGH (ref 3.7–4.7)
Alkaline Phosphatase: 79 IU/L (ref 44–121)
BUN/Creatinine Ratio: 16 (ref 10–24)
BUN: 23 mg/dL (ref 8–27)
Bilirubin Total: 0.7 mg/dL (ref 0.0–1.2)
CO2: 23 mmol/L (ref 20–29)
Calcium: 9.7 mg/dL (ref 8.6–10.2)
Chloride: 103 mmol/L (ref 96–106)
Creatinine, Ser: 1.44 mg/dL — ABNORMAL HIGH (ref 0.76–1.27)
Globulin, Total: 2.6 g/dL (ref 1.5–4.5)
Glucose: 109 mg/dL — ABNORMAL HIGH (ref 70–99)
Potassium: 5 mmol/L (ref 3.5–5.2)
Sodium: 141 mmol/L (ref 134–144)
Total Protein: 7.4 g/dL (ref 6.0–8.5)
eGFR: 51 mL/min/{1.73_m2} — ABNORMAL LOW (ref >59)

## 2021-05-25 LAB — LIPID PANEL
Chol/HDL Ratio: 2.7 ratio (ref 0.0–5.0)
Cholesterol, Total: 114 mg/dL (ref 100–199)
HDL: 42 mg/dL (ref >39)
LDL Chol Calc (NIH): 37 mg/dL (ref 0–99)
Triglycerides: 226 mg/dL — ABNORMAL HIGH (ref 0–149)
VLDL Cholesterol Cal: 35 mg/dL (ref 5–40)

## 2021-05-25 NOTE — Patient Instructions (Addendum)
Medication Instructions:  No Changes *If you need a refill on your cardiac medications before your next appointment, please call your pharmacy*   Lab Work: CBC,CMET,Lipid Panel,. Today If you have labs (blood work) drawn today and your tests are completely normal, you will receive your results only by: [place] (if you have MyChart) OR A paper copy in the mail If you have any lab test that is abnormal or we need to change your treatment, we will call you to review the results.   Testing/Procedures: Non-Cardiac CT scanning, (CAT scanning), is a noninvasive, special x-ray that produces cross-sectional images of the body using x-rays and a computer. CT scans help physicians diagnose and treat medical conditions. For some CT exams, a contrast material is used to enhance visibility in the area of the body being studied. CT scans provide greater clarity and reveal more details than regular x-ray exams.     Follow-Up: At [facility], you and your health needs are our priority.  As part of our continuing mission to provide you with exceptional heart care, we have created designated Provider Care Teams.  These Care Teams include your primary Cardiologist (physician) and Advanced Practice Providers (APPs -  Physician Assistants and Nurse Practitioners) who all work together to provide you with the care you need, when you need it.  We recommend signing up for the patient portal called "MyChart".  Sign up information is provided on this After Visit Summary.  MyChart is used to connect with patients for Virtual Visits (Telemedicine).  Patients are able to view lab/test results, encounter notes, upcoming appointments, etc.  Non-urgent messages can be sent to your provider as well.   To learn more about what you can do with MyChart, go to [email or web address].    Your next appointment:   1 year(s)  The format for your next appointment:   In Person  Provider:   Pixie Casino, MD

## 2021-05-30 ENCOUNTER — Other Ambulatory Visit: Payer: Self-pay

## 2021-05-30 ENCOUNTER — Ambulatory Visit (HOSPITAL_BASED_OUTPATIENT_CLINIC_OR_DEPARTMENT_OTHER)
Admission: RE | Admit: 2021-05-30 | Discharge: 2021-05-30 | Disposition: A | Discharge status: Home / Self Care | Payer: Medicare Other | Source: Ambulatory Visit | Attending: Physician Assistant | Admitting: Physician Assistant

## 2021-05-30 DIAGNOSIS — I2510 Atherosclerotic heart disease of native coronary artery without angina pectoris: Secondary | ICD-10-CM | POA: Diagnosis not present

## 2021-05-30 DIAGNOSIS — E782 Mixed hyperlipidemia: Secondary | ICD-10-CM

## 2021-05-30 DIAGNOSIS — I10 Essential (primary) hypertension: Secondary | ICD-10-CM | POA: Diagnosis not present

## 2021-05-30 DIAGNOSIS — I7121 Aneurysm of the ascending aorta, without rupture: Secondary | ICD-10-CM | POA: Insufficient documentation

## 2021-05-30 MED ORDER — IOHEXOL 350 MG/ML SOLN
75.0000 mL | Freq: Once | INTRAVENOUS | Status: AC | PRN
  Administered 2021-05-30: 75 mL via INTRAVENOUS

## 2021-05-31 ENCOUNTER — Telehealth: Payer: Self-pay

## 2021-05-31 NOTE — Telephone Encounter (Addendum)
Called patient regarding results left voicemail.----- Message from Warren Lacy, PA-C sent at 05/29/2021  2:48 PM EST ----- Kidney function relatively stable from 2 years ago.  Electrolytes normal.  Liver function normal.  Blood counts are overall normal.  LDL remained excellency controlled.  Triglycerides high.      To reduce triglycerides -- Avoid simple carbohydrates, such as sugar and foods made with white flour or fructose, trans fats, and foods with hydrogenated oils or fats. Instead of the fat found in meats, choose plant-based fats, such as olive oil and canola oil. Replace red meat with fish high in omega-3 fatty acids, such as mackerel or salmon.

## 2021-05-31 NOTE — Telephone Encounter (Addendum)
Left message for patient to call office----- Message from Warren Lacy, PA-C sent at 05/30/2021  9:46 PM EST ----- Stable appearance of ascending thoracic aortic aneurysm measuring 4.1 cm. Very reassuring.  Continue BP control with ideal systolic BP <932.

## 2021-06-07 ENCOUNTER — Telehealth: Payer: Self-pay

## 2021-06-07 NOTE — Telephone Encounter (Addendum)
Called patient regarding results. Patient had understanding of results.----- Message from Warren Lacy, PA-C sent at 05/30/2021  9:46 PM EST ----- Stable appearance of ascending thoracic aortic aneurysm measuring 4.1 cm. Very reassuring.  Continue BP control with ideal systolic BP <382.

## 2021-06-07 NOTE — Telephone Encounter (Addendum)
Called patients regarding results. Patient had understanding of results.----- Message from Warren Lacy, PA-C sent at 05/29/2021  2:48 PM EST ----- Kidney function relatively stable from 2 years ago.  Electrolytes normal.  Liver function normal.  Blood counts are overall normal.  LDL remained excellency controlled.  Triglycerides high.      To reduce triglycerides -- Avoid simple carbohydrates, such as sugar and foods made with white flour or fructose, trans fats, and foods with hydrogenated oils or fats. Instead of the fat found in meats, choose plant-based fats, such as olive oil and canola oil. Replace red meat with fish high in omega-3 fatty acids, such as mackerel or salmon.

## 2021-06-14 DIAGNOSIS — M109 Gout, unspecified: Secondary | ICD-10-CM | POA: Diagnosis not present

## 2021-06-14 DIAGNOSIS — I10 Essential (primary) hypertension: Secondary | ICD-10-CM | POA: Diagnosis not present

## 2021-06-14 DIAGNOSIS — E785 Hyperlipidemia, unspecified: Secondary | ICD-10-CM | POA: Diagnosis not present

## 2021-06-14 DIAGNOSIS — E291 Testicular hypofunction: Secondary | ICD-10-CM | POA: Diagnosis not present

## 2021-06-14 DIAGNOSIS — Z125 Encounter for screening for malignant neoplasm of prostate: Secondary | ICD-10-CM | POA: Diagnosis not present

## 2021-06-14 DIAGNOSIS — R7301 Impaired fasting glucose: Secondary | ICD-10-CM | POA: Diagnosis not present

## 2021-06-21 DIAGNOSIS — N183 Chronic kidney disease, stage 3 unspecified: Secondary | ICD-10-CM | POA: Diagnosis not present

## 2021-06-21 DIAGNOSIS — N1832 Chronic kidney disease, stage 3b: Secondary | ICD-10-CM | POA: Diagnosis not present

## 2021-06-21 DIAGNOSIS — G629 Polyneuropathy, unspecified: Secondary | ICD-10-CM | POA: Diagnosis not present

## 2021-06-21 DIAGNOSIS — I2510 Atherosclerotic heart disease of native coronary artery without angina pectoris: Secondary | ICD-10-CM | POA: Diagnosis not present

## 2021-06-21 DIAGNOSIS — Z23 Encounter for immunization: Secondary | ICD-10-CM | POA: Diagnosis not present

## 2021-06-21 DIAGNOSIS — Z0000 Encounter for general adult medical examination without abnormal findings: Secondary | ICD-10-CM | POA: Diagnosis not present

## 2021-06-21 DIAGNOSIS — I129 Hypertensive chronic kidney disease with stage 1 through stage 4 chronic kidney disease, or unspecified chronic kidney disease: Secondary | ICD-10-CM | POA: Diagnosis not present

## 2021-06-21 DIAGNOSIS — M109 Gout, unspecified: Secondary | ICD-10-CM | POA: Diagnosis not present

## 2021-06-21 DIAGNOSIS — I350 Nonrheumatic aortic (valve) stenosis: Secondary | ICD-10-CM | POA: Diagnosis not present

## 2021-06-21 DIAGNOSIS — J449 Chronic obstructive pulmonary disease, unspecified: Secondary | ICD-10-CM | POA: Diagnosis not present

## 2021-06-21 DIAGNOSIS — R7301 Impaired fasting glucose: Secondary | ICD-10-CM | POA: Diagnosis not present

## 2021-06-21 DIAGNOSIS — Z1339 Encounter for screening examination for other mental health and behavioral disorders: Secondary | ICD-10-CM | POA: Diagnosis not present

## 2021-06-21 DIAGNOSIS — I77819 Aortic ectasia, unspecified site: Secondary | ICD-10-CM | POA: Diagnosis not present

## 2021-06-21 DIAGNOSIS — E8809 Other disorders of plasma-protein metabolism, not elsewhere classified: Secondary | ICD-10-CM | POA: Diagnosis not present

## 2021-06-21 DIAGNOSIS — Z1331 Encounter for screening for depression: Secondary | ICD-10-CM | POA: Diagnosis not present

## 2021-06-21 DIAGNOSIS — R011 Cardiac murmur, unspecified: Secondary | ICD-10-CM | POA: Diagnosis not present

## 2021-08-07 ENCOUNTER — Other Ambulatory Visit: Payer: Self-pay | Admitting: Internal Medicine

## 2021-09-12 DIAGNOSIS — I10 Essential (primary) hypertension: Secondary | ICD-10-CM | POA: Diagnosis not present

## 2021-09-12 DIAGNOSIS — E785 Hyperlipidemia, unspecified: Secondary | ICD-10-CM | POA: Diagnosis not present

## 2021-12-29 DIAGNOSIS — E785 Hyperlipidemia, unspecified: Secondary | ICD-10-CM | POA: Diagnosis not present

## 2021-12-29 DIAGNOSIS — M109 Gout, unspecified: Secondary | ICD-10-CM | POA: Diagnosis not present

## 2021-12-29 DIAGNOSIS — N1832 Chronic kidney disease, stage 3b: Secondary | ICD-10-CM | POA: Diagnosis not present

## 2021-12-29 DIAGNOSIS — E8809 Other disorders of plasma-protein metabolism, not elsewhere classified: Secondary | ICD-10-CM | POA: Diagnosis not present

## 2021-12-29 DIAGNOSIS — R911 Solitary pulmonary nodule: Secondary | ICD-10-CM | POA: Diagnosis not present

## 2021-12-29 DIAGNOSIS — F17201 Nicotine dependence, unspecified, in remission: Secondary | ICD-10-CM | POA: Diagnosis not present

## 2021-12-29 DIAGNOSIS — I350 Nonrheumatic aortic (valve) stenosis: Secondary | ICD-10-CM | POA: Diagnosis not present

## 2021-12-29 DIAGNOSIS — M353 Polymyalgia rheumatica: Secondary | ICD-10-CM | POA: Diagnosis not present

## 2021-12-29 DIAGNOSIS — J449 Chronic obstructive pulmonary disease, unspecified: Secondary | ICD-10-CM | POA: Diagnosis not present

## 2021-12-29 DIAGNOSIS — R7301 Impaired fasting glucose: Secondary | ICD-10-CM | POA: Diagnosis not present

## 2021-12-29 DIAGNOSIS — I129 Hypertensive chronic kidney disease with stage 1 through stage 4 chronic kidney disease, or unspecified chronic kidney disease: Secondary | ICD-10-CM | POA: Diagnosis not present

## 2022-01-10 DIAGNOSIS — R051 Acute cough: Secondary | ICD-10-CM | POA: Diagnosis not present

## 2022-01-10 DIAGNOSIS — F17201 Nicotine dependence, unspecified, in remission: Secondary | ICD-10-CM | POA: Diagnosis not present

## 2022-01-10 DIAGNOSIS — J449 Chronic obstructive pulmonary disease, unspecified: Secondary | ICD-10-CM | POA: Diagnosis not present

## 2022-02-19 DIAGNOSIS — E785 Hyperlipidemia, unspecified: Secondary | ICD-10-CM | POA: Diagnosis not present

## 2022-02-19 DIAGNOSIS — R7989 Other specified abnormal findings of blood chemistry: Secondary | ICD-10-CM | POA: Diagnosis not present

## 2022-02-19 DIAGNOSIS — I10 Essential (primary) hypertension: Secondary | ICD-10-CM | POA: Diagnosis not present

## 2022-02-19 DIAGNOSIS — M109 Gout, unspecified: Secondary | ICD-10-CM | POA: Diagnosis not present

## 2022-02-23 ENCOUNTER — Other Ambulatory Visit (HOSPITAL_BASED_OUTPATIENT_CLINIC_OR_DEPARTMENT_OTHER): Payer: Self-pay

## 2022-02-23 DIAGNOSIS — Z23 Encounter for immunization: Secondary | ICD-10-CM | POA: Diagnosis not present

## 2022-02-23 MED ORDER — INFLUENZA VAC A&B SA ADJ QUAD 0.5 ML IM PRSY
PREFILLED_SYRINGE | INTRAMUSCULAR | 0 refills | Status: DC
  Filled 2022-02-23: qty 0.5, 1d supply, fill #0

## 2022-03-26 ENCOUNTER — Other Ambulatory Visit (HOSPITAL_BASED_OUTPATIENT_CLINIC_OR_DEPARTMENT_OTHER): Payer: Self-pay

## 2022-03-26 DIAGNOSIS — Z23 Encounter for immunization: Secondary | ICD-10-CM | POA: Diagnosis not present

## 2022-03-26 MED ORDER — COVID-19 MRNA VAC-TRIS(PFIZER) 30 MCG/0.3ML IM SUSY
0.3000 mL | PREFILLED_SYRINGE | Freq: Once | INTRAMUSCULAR | 0 refills | Status: AC
  Filled 2022-03-26: qty 0.3, 1d supply, fill #0

## 2022-05-07 ENCOUNTER — Telehealth: Payer: Self-pay | Admitting: Internal Medicine

## 2022-05-07 NOTE — Telephone Encounter (Signed)
PA for repatha submitted via CMM (Key: [id number]) Coverage Start Date:04/07/2022;Coverage End Date:05/07/2023;

## 2022-05-31 NOTE — Progress Notes (Signed)
Cardiology Clinic Note   Patient Name: Aaron Miller Date of Encounter: 06/01/2022  Primary Care Provider:  Crist Infante, MD Primary Cardiologist:  Pixie Casino, MD  Patient Profile    Aaron Miller is a 77 y.o. male with a past medical history of coronary atherosclerosis per CT chest, ascending aortic aneurysm, hyperlipidemia who presents to the clinic today for 1 year follow-up of chronic cardiac conditions.   Past Medical History    Past Medical History:  Diagnosis Date   Adenomatous polyp of colon 06/2019   diminutive - no routine repeat needed   Hyperlipidemia    Hypertension    Past Surgical History:  Procedure Laterality Date   COLONOSCOPY      Allergies  Allergies  Allergen Reactions   Statins Other (See Comments)    Myalgias - Crestor, pravastatin    History of Present Illness    Aaron Miller has a past medical history of: Coronary atherosclerosis.   CT chest (lung cancer screening) 03/03/2018: Aortic atherosclerosis.  Calcified atherosclerotic plaque in the LAD, LCx, RCA. Nuclear stress test 07/15/2018: Moderately decreased LVEF.  Low risk study with no ischemia or infarction.  Mild left ventricular enlargement. Echo 07/16/2018: EF 55 to 60%.  Mildly increased left ventricular wall thickness.  Impaired relaxation.  Aortic valve sclerosis without stenosis.  Mild AR. Ascending thoracic aortic aneurysm. CTA chest aorta 05/30/2021: Stable appearance of ascending thoracic aortic aneurysm 4.1 cm.  Stable appearance of left upper lobe pulmonary nodule measuring 6 mm.  Recommend annual imaging follow-up. Hyperlipidemia. Lipid panel 05/25/2021: LDL 37, HDL 42, TG 226, total 114. Hypertension.   Aaron Miller was first evaluated by Dr. Debara Pickett on 04/11/2018 after moving to the area from [place] for CAD and dyslipidemia as requested by PCP.  He is followed for the history outlined above.  Patient was last seen in the office on 01/15/2020 by Dr. Debara Pickett.  At that  time he was doing well with good response to Repatha.  No changes were made.  Today, patient is doing well. Patient denies shortness of breath or dyspnea on exertion. No chest pain, pressure, or tightness. Denies lower extremity edema, orthopnea, or PND. No palpitations. 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 He stays active with gardening when it is warm out.   Home Medications    Current Meds  Medication Sig   allopurinol (ZYLOPRIM) 300 MG tablet Take 300 mg by mouth daily.   aspirin 81 MG tablet Take 81 mg by mouth daily.   COVID-19 mRNA bivalent vaccine, Moderna, (MODERNA COVID-19 BIVAL BOOSTER) 50 MCG/0.5ML injection Inject into the muscle.   indomethacin (INDOCIN) 50 MG capsule Take 50 mg by mouth 2 (two) times daily.   influenza vaccine adjuvanted (FLUAD QUADRIVALENT) 0.5 ML injection Inject into the muscle.   influenza vaccine adjuvanted (FLUAD) 0.5 ML injection Inject into the muscle.   mirtazapine (REMERON) 15 MG tablet Take 15 mg by mouth at bedtime.   REPATHA SURECLICK 272 MG/ML SOAJ INJECT 1 DOSE INTO THE SKIN EVERY 14 (FOURTEEN) DAYS.   telmisartan (MICARDIS) 20 MG tablet Take 20 mg by mouth daily.    Family History    Family History  Problem Relation Age of Onset   Lung cancer Mother    Hypertension Father    Diabetes Father    Lung cancer Maternal Grandmother    Colon cancer Neg Hx    Colon polyps Neg Hx  Pancreatic cancer Neg Hx    Stomach cancer Neg Hx    Esophageal cancer Neg Hx    He indicated that his mother is deceased. He indicated that his father is deceased. He indicated that his brother is alive. He indicated that his maternal grandmother is deceased. He indicated that his maternal grandfather is deceased. He indicated that his paternal grandmother is deceased. He indicated that his paternal grandfather is deceased. He indicated that the status of his neg hx is unknown.   Social History    Social  History   Socioeconomic History   Marital status: Married    Spouse name: Not on file   Number of children: Not on file   Years of education: Not on file   Highest education level: Not on file  Occupational History   Not on file  Tobacco Use   Smoking status: Former    Types: Cigarettes    Quit date: 2017    Years since quitting: 7.0   Smokeless tobacco: Never  Vaping Use   Vaping Use: Never used  Substance and Sexual Activity   Alcohol use: Yes    Comment: 2 glasses of wine daily   Drug use: Never   Sexual activity: Not on file  Other Topics Concern   Not on file  Social History Narrative   Married.  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  Masters degree   Social Determinants of Radio broadcast assistant Strain: Not on file  Food Insecurity: Not on file  Transportation Needs: Not on file  Physical Activity: Not on file  Stress: Not on file  Social Connections: Not on file  Intimate Partner Violence: Not on file     Review of Systems    General:  No chills, fever, night sweats or weight changes.  Cardiovascular:  No chest pain, dyspnea on exertion, edema, orthopnea, palpitations, paroxysmal nocturnal dyspnea. Dermatological: No rash, lesions/masses Respiratory: No cough, dyspnea Urologic: No hematuria, dysuria Abdominal:   No nausea, vomiting, diarrhea, bright red blood per rectum, melena, or hematemesis Neurologic:  No visual changes, weakness, changes in mental status. All other systems reviewed and are otherwise negative except as noted above.  Physical Exam    VS:  BP 128/78   Pulse 77   Ht '5\' 8"'$  (1.727 m)   Wt 158 lb 9.6 oz (71.9 kg)   SpO2 96%   BMI 24.12 kg/m  , BMI Body mass index is 24.12 kg/m. GEN:  Well nourished, well developed, in no acute distress. HEENT: Normal. Neck: Supple, no JVD, carotid bruits, or masses. Cardiac: RRR, no murmurs, rubs, or gallops. No clubbing, cyanosis, edema.  Radials/DP/PT 2+ and  equal bilaterally.  Respiratory:  Respirations regular and unlabored, clear to auscultation bilaterally. GI: Soft, nontender, nondistended. MS: No deformity or atrophy. Skin: Warm and dry, no rash. Neuro: Strength and sensation are intact. Psych: Normal affect.  Accessory Clinical Findings     Recent Labs: No results found for requested labs within last 365 days.   Recent Lipid Panel    Component Value Date/Time   CHOL 114 05/25/2021 0917   TRIG 226 (H) 05/25/2021 0917   HDL 42 05/25/2021 0917   CHOLHDL 2.7 05/25/2021 0917   LDLCALC 37 05/25/2021 0917    ECG personally reviewed by me today: NSR, HR 77  No significant changes from 05/25/2021.No ectopy today.    Assessment & Plan   Coronary atherosclerosis.  Coronary calcifications found  incidentally on CT chest for lung cancer screening in October 2019.  Patient underwent nuclear stress February 2020 which was a low risk study without ischemia or infarction.  Stress test showed moderately decreased LVEF.  Follow-up echo showed EF 55 to 60% with some impaired relaxation and aortic valve sclerosis without stenosis.  Patient denies chest pain, tightness or pressure.  Continue aspirin, Repatha. Ascending thoracic aortic aneurysm.  CTA January 2023 showed stable appearance of ascending thoracic aortic aneurysm 4.1 cm.  Recommend yearly imaging follow-up.  Patient denies chest pain, back pain, shortness of breath, hypotension, dizziness or syncope.  Will order repeat CTA chest aorta. Hyperlipidemia.  LDL January 2023 37, at goal.  [place]. Hypertension.  BP today 128/72.  Patient denies headaches or dizziness.  Continue telmisartan.     Disposition: CTA chest aorta. Return in 1 year or sooner as needed.    Justice Britain. Zenita Kister, DNP, NP-C     06/01/2022, 10:31 AM [place] [address] Office [phone] Fax [phone]

## 2022-06-01 ENCOUNTER — Encounter: Payer: Self-pay | Admitting: General Practice

## 2022-06-01 ENCOUNTER — Ambulatory Visit: Payer: Medicare Other | Attending: General Practice | Admitting: Student

## 2022-06-01 VITALS — BP 128/78 | HR 77 | Ht 68.0 in | Wt 158.6 lb

## 2022-06-01 DIAGNOSIS — E785 Hyperlipidemia, unspecified: Secondary | ICD-10-CM | POA: Insufficient documentation

## 2022-06-01 DIAGNOSIS — I7121 Aneurysm of the ascending aorta, without rupture: Secondary | ICD-10-CM | POA: Diagnosis not present

## 2022-06-01 DIAGNOSIS — I2510 Atherosclerotic heart disease of native coronary artery without angina pectoris: Secondary | ICD-10-CM | POA: Insufficient documentation

## 2022-06-01 DIAGNOSIS — I10 Essential (primary) hypertension: Secondary | ICD-10-CM | POA: Insufficient documentation

## 2022-06-01 DIAGNOSIS — I2584 Coronary atherosclerosis due to calcified coronary lesion: Secondary | ICD-10-CM | POA: Diagnosis not present

## 2022-06-01 NOTE — Patient Instructions (Addendum)
Medication Instructions:  Your physician recommends that you continue on your current medications as directed. Please refer to the Current Medication list given to you today.  *If you need a refill on your cardiac medications before your next appointment, please call your pharmacy*  Lab Work: NONE ordered at this time of appointment   If you have labs (blood work) drawn today and your tests are completely normal, you will receive your results only by: [place] (if you have MyChart) OR A paper copy in the mail If you have any lab test that is abnormal or we need to change your treatment, we will call you to review the results.  Testing/Procedures: Non-Cardiac CT Angiography (CTA), is a special type of CT scan that uses a computer to produce multi-dimensional views of major blood vessels throughout the body. In CT angiography, a contrast material is injected through an IV to help visualize the blood vessels   Follow-Up: At [facility], you and your health needs are our priority.  As part of our continuing mission to provide you with exceptional heart care, we have created designated Provider Care Teams.  These Care Teams include your primary Cardiologist (physician) and Advanced Practice Providers (APPs -  Physician Assistants and Nurse Practitioners) who all work together to provide you with the care you need, when you need it.  Your next appointment:   1 year(s)  Provider:   Pixie Casino, MD     Other Instructions

## 2022-06-19 ENCOUNTER — Other Ambulatory Visit: Payer: Self-pay | Admitting: Internal Medicine

## 2022-06-19 DIAGNOSIS — E782 Mixed hyperlipidemia: Secondary | ICD-10-CM

## 2022-06-19 DIAGNOSIS — I2510 Atherosclerotic heart disease of native coronary artery without angina pectoris: Secondary | ICD-10-CM

## 2022-06-26 DIAGNOSIS — E785 Hyperlipidemia, unspecified: Secondary | ICD-10-CM | POA: Diagnosis not present

## 2022-06-26 DIAGNOSIS — M109 Gout, unspecified: Secondary | ICD-10-CM | POA: Diagnosis not present

## 2022-06-26 DIAGNOSIS — R7989 Other specified abnormal findings of blood chemistry: Secondary | ICD-10-CM | POA: Diagnosis not present

## 2022-06-26 DIAGNOSIS — Z125 Encounter for screening for malignant neoplasm of prostate: Secondary | ICD-10-CM | POA: Diagnosis not present

## 2022-06-26 DIAGNOSIS — I700 Atherosclerosis of aorta: Secondary | ICD-10-CM | POA: Diagnosis not present

## 2022-06-26 DIAGNOSIS — N1832 Chronic kidney disease, stage 3b: Secondary | ICD-10-CM | POA: Diagnosis not present

## 2022-07-03 DIAGNOSIS — R82998 Other abnormal findings in urine: Secondary | ICD-10-CM | POA: Diagnosis not present

## 2022-07-03 DIAGNOSIS — I77819 Aortic ectasia, unspecified site: Secondary | ICD-10-CM | POA: Diagnosis not present

## 2022-07-03 DIAGNOSIS — I2510 Atherosclerotic heart disease of native coronary artery without angina pectoris: Secondary | ICD-10-CM | POA: Diagnosis not present

## 2022-07-03 DIAGNOSIS — J449 Chronic obstructive pulmonary disease, unspecified: Secondary | ICD-10-CM | POA: Diagnosis not present

## 2022-07-03 DIAGNOSIS — I10 Essential (primary) hypertension: Secondary | ICD-10-CM | POA: Diagnosis not present

## 2022-07-03 DIAGNOSIS — I129 Hypertensive chronic kidney disease with stage 1 through stage 4 chronic kidney disease, or unspecified chronic kidney disease: Secondary | ICD-10-CM | POA: Diagnosis not present

## 2022-07-03 DIAGNOSIS — Z0000 Encounter for general adult medical examination without abnormal findings: Secondary | ICD-10-CM | POA: Diagnosis not present

## 2022-07-03 DIAGNOSIS — N1832 Chronic kidney disease, stage 3b: Secondary | ICD-10-CM | POA: Diagnosis not present

## 2022-07-03 DIAGNOSIS — I350 Nonrheumatic aortic (valve) stenosis: Secondary | ICD-10-CM | POA: Diagnosis not present

## 2022-07-03 DIAGNOSIS — R911 Solitary pulmonary nodule: Secondary | ICD-10-CM | POA: Diagnosis not present

## 2022-07-03 DIAGNOSIS — E785 Hyperlipidemia, unspecified: Secondary | ICD-10-CM | POA: Diagnosis not present

## 2022-07-03 DIAGNOSIS — I700 Atherosclerosis of aorta: Secondary | ICD-10-CM | POA: Diagnosis not present

## 2022-07-03 DIAGNOSIS — G629 Polyneuropathy, unspecified: Secondary | ICD-10-CM | POA: Diagnosis not present

## 2022-07-03 DIAGNOSIS — R7301 Impaired fasting glucose: Secondary | ICD-10-CM | POA: Diagnosis not present

## 2022-07-05 ENCOUNTER — Other Ambulatory Visit (HOSPITAL_BASED_OUTPATIENT_CLINIC_OR_DEPARTMENT_OTHER): Payer: Medicare Other

## 2022-07-13 ENCOUNTER — Telehealth: Payer: Self-pay | Admitting: Internal Medicine

## 2022-07-13 ENCOUNTER — Ambulatory Visit (HOSPITAL_BASED_OUTPATIENT_CLINIC_OR_DEPARTMENT_OTHER)
Admission: RE | Admit: 2022-07-13 | Discharge: 2022-07-13 | Disposition: A | Discharge status: Home / Self Care | Payer: Medicare Other | Source: Ambulatory Visit | Attending: Student | Admitting: Student

## 2022-07-13 DIAGNOSIS — I7121 Aneurysm of the ascending aorta, without rupture: Secondary | ICD-10-CM | POA: Diagnosis not present

## 2022-07-13 MED ORDER — IOHEXOL 350 MG/ML SOLN
100.0000 mL | Freq: Once | INTRAVENOUS | Status: AC | PRN
  Administered 2022-07-13: 75 mL via INTRAVENOUS

## 2022-07-13 NOTE — Telephone Encounter (Signed)
[facility] Radiology calling with critical results.

## 2022-07-13 NOTE — Telephone Encounter (Signed)
[facility] Radiology calling with critical results  CT done at [facility]:   Wanted to make sure that we have the whole report. The Radiologist Highlighted Impression number 3 and 4; But "especially" Impression Number 4.   Narrative & Impression  CLINICAL DATA:  Evaluate thoracic aortic aneurysm.   EXAM: CT ANGIOGRAPHY CHEST WITH CONTRAST   TECHNIQUE: Multidetector CT imaging of the chest was performed using the standard protocol during bolus administration of intravenous contrast. Multiplanar CT image reconstructions and MIPs were obtained to evaluate the vascular anatomy.   RADIATION DOSE REDUCTION: This exam was performed according to the departmental dose-optimization program which includes automated exposure control, adjustment of the mA and/or kV according to patient size and/or use of iterative reconstruction technique.   CONTRAST:  36m OMNIPAQUE IOHEXOL 350 MG/ML SOLN   COMPARISON:  05/30/2021; 05/31/2020; 01/28/2020; 04/28/2019; 12/09/2018; 07/07/2018; 03/03/2018   FINDINGS: Vascular Findings:   Stable uncomplicated mild fusiform aneurysmal dilatation of the ascending thoracic aorta with measurements as follows. The thoracic aorta tapers to a normal caliber at the level of the aortic arch. The descending thoracic aorta is of normal caliber and widely patent without hemodynamically significant narrowing. No evidence of thoracic aortic dissection or perivascular stranding on this nongated examination   Conventional configuration of the aortic arch. The branch vessels of the aortic arch appear patent throughout their imaged courses.   Normal heart size. Coronary artery calcifications. No pericardial effusion though small amount of fluid is seen with the pericardial recess.   Although this examination was not tailored for the evaluation the pulmonary arteries, there are no discrete filling defects within the central pulmonary arterial tree to suggest central  pulmonary embolism. Normal caliber of the main pulmonary artery.   -------------------------------------------------------------   Thoracic aortic measurements:   SINOTUBULAR JUNCTION: 35 mm as measured in greatest oblique short axis coronal dimension.   PROXIMAL ASCENDING THORACIC AORTA: 42 mm as measured in greatest oblique short axis axial dimension (axial image 62, series 4) at the level of the main pulmonary artery and approximately 41 mm as measured in greatest oblique short axis coronal dimension (coronal image 74, series 7), unchanged compared to the 02/2018 examination.   AORTIC ARCH: 31 mm as measured in greatest oblique short axis sagittal dimension.   PROXIMAL DESCENDING THORACIC AORTA: 29 mm as measured in greatest oblique short axis axial dimension at the level of the main pulmonary artery.   DISTAL DESCENDING THORACIC AORTA: 26 mm as measured in greatest oblique short axis axial dimension at the level of the diaphragmatic hiatus.   Review of the MIP images confirms the above findings.   -------------------------------------------------------------   Non-Vascular Findings:   Mediastinum/Lymph Nodes: No bulky mediastinal, hilar or axillary lymphadenopathy.   Lungs/Pleura: No worrisome new or enlarging pulmonary nodules. Punctate (0.6 cm) left apical pulmonary nodule (image 24, series 6) is unchanged compared to the 01/2020 examination, at the location of left apical pneumonia demonstrated on the 04/2019 examination. The approximately 1.3 x 0.9 cm cystic lesion within the right upper lobe (image 51, series 6), is unchanged since at least the 04/2019 examination.   Minimal dependent subpleural ground-glass atelectasis. No discrete focal airspace opacities. No pleural effusion. The central pulmonary airways appear widely patent.   Upper abdomen: Limited early arterial phase evaluation of the upper abdomen suggest mild nodularity of the hepatic contour.    Musculoskeletal: There is a mild (approximately 25%) apparently acute/subacute compression fracture involving the superior endplate of T7 (sagittal image 88, series 9, new compared to  the 05/2021 examination. Stigmata of dish within the thoracic spine. Regional soft tissues appear normal. Normal appearance of the thyroid gland.   IMPRESSION: 1. Stable uncomplicated fusiform aneurysmal dilatation of the ascending thoracic aorta measuring 41-42 mm in diameter, grossly unchanged compared to the 02/2018 examination. Aortic aneurysm NOS (ICD10-I71.9). 2. Coronary artery calcifications. Aortic Atherosclerosis (ICD10-I70.0). 3. Punctate (6 mm) left upper lobe pulmonary nodule is unchanged since the 01/2020 examination and is at the location of left apical pneumonia demonstrated on the 04/2019 examination. Imaging stability for greater than 2 years is indicative of a benign etiology. 4. Mild (approximately 25%) suspected acute/subacute compression fracture involving the superior endplate of T7, new compared to the 05/2021 examination. Correlation for point tenderness at this location is advised. Further evaluation with thoracic spine MRI could be performed as indicated.   These results will be called to the ordering clinician or representative by the Radiologist Assistant, and communication documented in the PACS or [employer].     Electronically Signed   By: Sandi Mariscal M.D.   On: 07/13/2022 15:26   Will send to ordering provider Mayra Reel, NP

## 2022-08-06 DIAGNOSIS — Z13820 Encounter for screening for osteoporosis: Secondary | ICD-10-CM | POA: Diagnosis not present

## 2022-08-09 ENCOUNTER — Other Ambulatory Visit: Payer: Self-pay | Admitting: Internal Medicine

## 2022-08-09 DIAGNOSIS — M549 Dorsalgia, unspecified: Secondary | ICD-10-CM

## 2022-08-17 ENCOUNTER — Encounter: Payer: Self-pay | Admitting: Internal Medicine

## 2022-08-20 ENCOUNTER — Other Ambulatory Visit: Payer: Medicare Other

## 2023-01-14 DIAGNOSIS — Z23 Encounter for immunization: Secondary | ICD-10-CM | POA: Diagnosis not present

## 2023-04-22 ENCOUNTER — Other Ambulatory Visit: Payer: Self-pay | Admitting: Internal Medicine

## 2023-04-22 DIAGNOSIS — I2510 Atherosclerotic heart disease of native coronary artery without angina pectoris: Secondary | ICD-10-CM

## 2023-04-22 DIAGNOSIS — E782 Mixed hyperlipidemia: Secondary | ICD-10-CM

## 2023-04-23 DIAGNOSIS — I129 Hypertensive chronic kidney disease with stage 1 through stage 4 chronic kidney disease, or unspecified chronic kidney disease: Secondary | ICD-10-CM | POA: Diagnosis not present

## 2023-04-23 DIAGNOSIS — G629 Polyneuropathy, unspecified: Secondary | ICD-10-CM | POA: Diagnosis not present

## 2023-04-23 DIAGNOSIS — N1832 Chronic kidney disease, stage 3b: Secondary | ICD-10-CM | POA: Diagnosis not present

## 2023-04-23 DIAGNOSIS — R7301 Impaired fasting glucose: Secondary | ICD-10-CM | POA: Diagnosis not present

## 2023-05-09 ENCOUNTER — Telehealth: Payer: Self-pay | Admitting: Internal Medicine

## 2023-05-09 ENCOUNTER — Other Ambulatory Visit (HOSPITAL_COMMUNITY): Payer: Self-pay

## 2023-05-09 ENCOUNTER — Telehealth: Payer: Self-pay | Admitting: Pharmacy Technician

## 2023-05-09 NOTE — Telephone Encounter (Signed)
Pharmacy Patient Advocate Encounter   Received notification from Pt Calls Messages that prior authorization for repatha is required/requested.   Insurance verification completed.   The patient is insured through [employer] .   Per test claim: PA required; PA submitted to above mentioned insurance via CoverMyMeds Key/confirmation #/EOC [id number] Status is pending

## 2023-05-09 NOTE — Telephone Encounter (Signed)
Patient called in yesterday, 12/18 and spoke with after hours. Per documentation "He needs new Rx for Repatha injection/using [facility] [address]. He is completely out."  Called the pharmacy to confirm if prescription was needed due to one being sent 12/02.   Pharmacy advised PA is needed.   Please advise.

## 2023-05-09 NOTE — Telephone Encounter (Signed)
Spoke to patient Repatha approved.Prescription sent to your pharmacy.

## 2023-05-09 NOTE — Telephone Encounter (Signed)
Message sent to Prior Auth team. 

## 2023-05-09 NOTE — Telephone Encounter (Signed)
Pharmacy Patient Advocate Encounter  Received notification from [employer] that Prior Authorization for repatha has been APPROVED from 04/09/23 to 05/08/24   PA #/Case ID/Reference #: [id number]

## 2023-05-30 ENCOUNTER — Ambulatory Visit: Payer: Medicare Other | Attending: Internal Medicine | Admitting: Internal Medicine

## 2023-05-30 ENCOUNTER — Encounter: Payer: Self-pay | Admitting: Internal Medicine

## 2023-05-30 VITALS — BP 120/78 | HR 75 | Ht 68.0 in | Wt 169.6 lb

## 2023-05-30 DIAGNOSIS — I2510 Atherosclerotic heart disease of native coronary artery without angina pectoris: Secondary | ICD-10-CM | POA: Insufficient documentation

## 2023-05-30 DIAGNOSIS — I7121 Aneurysm of the ascending aorta, without rupture: Secondary | ICD-10-CM | POA: Insufficient documentation

## 2023-05-30 DIAGNOSIS — I493 Ventricular premature depolarization: Secondary | ICD-10-CM | POA: Diagnosis not present

## 2023-05-30 DIAGNOSIS — I351 Nonrheumatic aortic (valve) insufficiency: Secondary | ICD-10-CM | POA: Diagnosis not present

## 2023-05-30 NOTE — Patient Instructions (Signed)
 Medication Instructions:  NO CHANGES  *If you need a refill on your cardiac medications before your next appointment, please call your pharmacy*   Testing/Procedures: Your physician has requested that you have an echocardiogram. Echocardiography is a painless test that uses sound waves to create images of your heart. It provides your doctor with information about the size and shape of your heart and how well your heart's chambers and valves are working. This procedure takes approximately one hour. There are no restrictions for this procedure. Please do NOT wear cologne, perfume, aftershave, or lotions (deodorant is allowed). Please arrive 15 minutes prior to your appointment time.  Please note: We ask at that you not bring children with you during ultrasound (echo/ vascular) testing. Due to room size and safety concerns, children are not allowed in the ultrasound rooms during exams. Our front office staff cannot provide observation of children in our lobby area while testing is being conducted. An adult accompanying a patient to their appointment will only be allowed in the ultrasound room at the discretion of the ultrasound technician under special circumstances. We apologize for any inconvenience.  Test locations: [facility] - [address] 3rd Floor -- [facility] - [address] [place]    Follow-Up: At [facility], you and your health needs are our priority.  As part of our continuing mission to provide you with exceptional heart care, we have created designated Provider Care Teams.  These Care Teams include your primary Cardiologist (physician) and Advanced Practice Providers (APPs -  Physician Assistants and Nurse Practitioners) who all work together to provide you with the care you need, when you need it.  We recommend signing up for the patient portal called MyChart.  Sign up information is provided on this After Visit  Summary.  MyChart is used to connect with patients for Virtual Visits (Telemedicine).  Patients are able to view lab/test results, encounter notes, upcoming appointments, etc.  Non-urgent messages can be sent to your provider as well.   To learn more about what you can do with MyChart, go to [email or web address].    Your next appointment:   6 months with Dr. Mona  ** CALL in March for a July appointment  Other Instructions    1st Floor: - Lobby - Registration  - Pharmacy  - Lab - Cafe  2nd Floor: - PV Lab - Diagnostic Testing (echo, CT, nuclear med)  3rd Floor: - Vacant  4th Floor: - TCTS (cardiothoracic surgery) - AFib Clinic - Structural Heart Clinic - Vascular Surgery  - Vascular Ultrasound  5th Floor: - [facility] Cardiology (general and EP) - Clinical Pharmacy for coumadin, hypertension, lipid, weight-loss medications, and med management appointments    Valet parking services will be available as well.

## 2023-05-30 NOTE — Progress Notes (Signed)
 LIPID CLINIC CONSULT NOTE  Chief Complaint:  Follow-up  Primary Care Physician: Shayne Anes, MD  HPI:  Aaron Miller is a 78 y.o. male who is being seen today for the evaluation of CAC and dyslipidemia at the request of Shayne Anes, MD.  This is a pleasant 78 year old male who was kindly referred to me by Dr. Mathews for evaluation of dyslipidemia, statin intolerance and multivessel coronary artery calcification.  [name] recently moved to [place] but have been living in [place] , previously in [place] as well as in [place] for which she worked for the college of [employer].  Initially went to school in [place] at the [employer].  He has a past medical history significant for hypertension and dyslipidemia as well as smoking.  He underwent a screening CT scan for possible lung malignancy and was found to have a subcentimeter nodule.  This will require follow-up in February.  In addition he was noted to have multivessel coronary artery calcification, as well as an ectatic and dilated ascending aorta to 4.2 cm.  Unfortunately he has been statin intolerant.  Most recently was on rosuvastatin and had significant myalgias.  He was taken off of this and has a marked dyslipidemia.  As of March 27, 2018 total cholesterol is 194, triglycerides 228, HDL 47 LDL 101.  He was started on Repatha  per his primary care provider and has had one injection.  He was referred I presume for cardiovascular evaluation given his findings of multivessel coronary artery calcification and mild aortic stenosis.  [name] reports he is quite physically active.  He says he is asymptomatic with exercise and activity.  He denies any chest pain or shortness of breath.  He said he did have stress testing in February this year in [place]  that was ordered by his primary care provider which was apparently negative.  01/15/2020  [name] returns today for follow-up of  dyslipidemia.  He is tolerating Repatha  well without any significant side effects.  This has been quite successful with a marked reduction in his lipids and total cholesterol 109, triglycerides 167, HDL 42 and LDL of 34.  Overall marked improvement in all categories of his lipids.  He is very pleased with those results.  EKG was performed today showed sinus rhythm at 61.  05/30/2023  [name] is seen today in follow-up.  He continues on Repatha  and has had very good control of his lipids with LDL last year 31.  He sees his primary care provider coming up in next month.  Unfortunately his wife passed away in 2023/11/05.  Recently has developed some neuropathy of his legs.  I also noted today that he is developed some tremor.  This seems to be more of a rest tremor.  No history of tremor in the family.  His last echo was in 2020 which showed some aortic valve sclerosis and mild aortic insufficiency.  The ascending aorta was dilated at 43 mm.  He has had follow-up CT angiograms of the aorta which shows stable aortic aneurysm between 41 and 42 mm.  He denies any chest pain or worsening shortness of breath.  Of note today he is EKG showed PVCs.  I noted these on exam as well.  He is asymptomatic with them however they are reasonably frequent.  PMHx:  Past Medical History:  Diagnosis Date   Adenomatous polyp of colon 06/2019   diminutive - no routine repeat needed   Hyperlipidemia  Hypertension     Past Surgical History:  Procedure Laterality Date   COLONOSCOPY      FAMHx:  Family History  Problem Relation Age of Onset   Lung cancer Mother    Hypertension Father    Diabetes Father    Lung cancer Maternal Grandmother    Colon cancer Neg Hx    Colon polyps Neg Hx    Pancreatic cancer Neg Hx    Stomach cancer Neg Hx    Esophageal cancer Neg Hx     SOCHx:   reports that he quit smoking about 8 years ago. His smoking use included cigarettes. He has never used smokeless tobacco. He reports current  alcohol  use. He reports that he does not use drugs.  ALLERGIES:  Allergies  Allergen Reactions   Statins Other (See Comments)    Myalgias - Crestor, pravastatin    ROS: Pertinent items noted in HPI and remainder of comprehensive ROS otherwise negative.  HOME MEDS: Current Outpatient Medications on File Prior to Visit  Medication Sig Dispense Refill   aspirin 81 MG tablet Take 81 mg by mouth daily.     COVID-19 mRNA bivalent vaccine, Moderna, (MODERNA COVID-19 BIVAL BOOSTER) 50 MCG/0.5ML injection Inject into the muscle. 0.5 mL 0   Evolocumab  (REPATHA  SURECLICK) 140 MG/ML SOAJ INJECT 1 DOSE INTO THE SKIN EVERY 14 (FOURTEEN) DAYS. 6 mL 3   influenza vaccine adjuvanted (FLUAD  QUADRIVALENT) 0.5 ML injection Inject into the muscle. 0.5 mL 0   influenza vaccine adjuvanted (FLUAD ) 0.5 ML injection Inject into the muscle. 0.5 mL 0   mirtazapine  (REMERON ) 15 MG tablet Take 15 mg by mouth at bedtime.     telmisartan  (MICARDIS ) 20 MG tablet Take 20 mg by mouth daily.     allopurinol  (ZYLOPRIM ) 300 MG tablet Take 300 mg by mouth daily. (Patient not taking: Reported on 05/30/2023)     indomethacin (INDOCIN) 50 MG capsule Take 50 mg by mouth 2 (two) times daily.     No current facility-administered medications on file prior to visit.    LABS/IMAGING: No results found for this or any previous visit (from the past 48 hours). No results found.  LIPID PANEL:    Component Value Date/Time   CHOL 114 05/25/2021 0917   TRIG 226 (H) 05/25/2021 0917   HDL 42 05/25/2021 0917   CHOLHDL 2.7 05/25/2021 0917   LDLCALC 37 05/25/2021 0917    WEIGHTS: Wt Readings from Last 3 Encounters:  05/30/23 169 lb 9.6 oz (76.9 kg)  06/01/22 158 lb 9.6 oz (71.9 kg)  05/25/21 162 lb 12.8 oz (73.8 kg)    VITALS: BP 120/78 (BP Location: Left Arm, Patient Position: Sitting)   Pulse 75   Ht 5' 8 (1.727 m)   Wt 169 lb 9.6 oz (76.9 kg)   SpO2 96%   BMI 25.79 kg/m   EXAM: General appearance: alert and no  distress Neck: no carotid bruit, no JVD, and thyroid  not enlarged, symmetric, no tenderness/mass/nodules Lungs: clear to auscultation bilaterally Heart: regular rate and rhythm, S1, S2 normal, and diastolic murmur: early diastolic 2/6, blowing at 2nd left intercostal space Abdomen: soft, non-tender; bowel sounds normal; no masses,  no organomegaly Extremities: extremities normal, atraumatic, no cyanosis or edema Pulses: 2+ and symmetric Skin: Skin color, texture, turgor normal. No rashes or lesions Neurologic: Mental status: Alert, oriented, thought content appropriate, mild bilateral wrist hand tremor noted Psych: Pleasant  EKG: EKG Interpretation Date/Time:  Thursday May 30 2023 08:01:45 EST Ventricular Rate:  75  PR Interval:  164 QRS Duration:  74 QT Interval:  378 QTC Calculation: 422 R Axis:   22  Text Interpretation: Sinus rhythm with occasional Premature ventricular complexes No previous ECGs available Compared to previous tracing there are now PVC's Confirmed by Mona Kent [phone]) on 05/30/2023 8:19:35 AM    ASSESSMENT: Dyslipidemia with goal LDL less than 70 Statin intolerance Multivessel coronary artery calcification Dilated aorta-41-42 mm (2024) Aortic sclerosis with mild aortic insufficiency PVCs Tremor/neuropathy  PLAN: 1.   Mr. Milian seems to be well from a cardiac standpoint.  His lipids are well-controlled on Repatha .  He is developed some peripheral neuropathy and I noted that he has a rest tremor on exam today.  He does have a referral to see neurology in March.  He does have a history of aortic sclerosis and aortic insufficiency.  That murmur is noted again today.  His last echo was in 2020.  He is now having frequent PVCs.  I am concerned about risk of cardiomyopathy or perhaps an ischemic etiology of his PVCs given his history of multivessel coronary artery calcification, although he is asymptomatic.  I like to get another echo to evaluate his LV function.   Will reach out to him with those results.  Plan likely follow-up annually unless further adjustments to his medications are necessary.  He did wear a monitor in 2020 which was normal.  Kent KYM Mona, MD, [facility], FACP  [place]  [facility]  Medical Director of the Advanced Lipid Disorders &  Cardiovascular Risk Reduction Clinic Diplomate of the American Board of Clinical Lipidology Attending Cardiologist  Direct Dial: [phone]  Fax: [phone]  Website:  [email or web address]  Kent BROCKS Chavonne Sforza 05/30/2023, 8:19 AM

## 2023-07-01 ENCOUNTER — Ambulatory Visit (HOSPITAL_COMMUNITY)
Admission: RE | Admit: 2023-07-01 | Discharge: 2023-07-01 | Disposition: A | Discharge status: Home / Self Care | Payer: Medicare Other | Source: Ambulatory Visit | Attending: Internal Medicine | Admitting: Internal Medicine

## 2023-07-01 DIAGNOSIS — I7121 Aneurysm of the ascending aorta, without rupture: Secondary | ICD-10-CM | POA: Diagnosis not present

## 2023-07-01 DIAGNOSIS — I351 Nonrheumatic aortic (valve) insufficiency: Secondary | ICD-10-CM | POA: Insufficient documentation

## 2023-07-01 DIAGNOSIS — I493 Ventricular premature depolarization: Secondary | ICD-10-CM | POA: Diagnosis not present

## 2023-07-01 LAB — ECHOCARDIOGRAM COMPLETE
AR max vel: 3.21 cm2
AV Area VTI: 3.37 cm2
AV Area mean vel: 3.02 cm2
AV Mean grad: 2.5 mm[Hg]
AV Peak grad: 4.2 mm[Hg]
Ao pk vel: 1.02 m/s
Area-P 1/2: 3.83 cm2
Calc EF: 56.6 %
MV M vel: 1.19 m/s
MV Peak grad: 5.7 mm[Hg]
P 1/2 time: 392 ms
S' Lateral: 3.27 cm
Single Plane A2C EF: 63.6 %
Single Plane A4C EF: 54.7 %

## 2023-07-25 ENCOUNTER — Other Ambulatory Visit (HOSPITAL_BASED_OUTPATIENT_CLINIC_OR_DEPARTMENT_OTHER): Payer: Self-pay

## 2023-07-25 MED ORDER — TELMISARTAN 80 MG PO TABS: 80.0000 mg | ORAL_TABLET | Freq: Every day | ORAL | 3 refills | Status: DC

## 2023-07-25 MED ORDER — MIRTAZAPINE 15 MG PO TABS
15.0000 mg | ORAL_TABLET | Freq: Every day | ORAL | 4 refills | Status: AC
  Filled 2023-10-15: qty 90, 90d supply, fill #0
  Filled 2024-01-14: qty 90, 90d supply, fill #1
  Filled 2024-04-09: qty 90, 90d supply, fill #2

## 2023-07-25 MED ORDER — ALLOPURINOL 100 MG PO TABS: 100.0000 mg | ORAL_TABLET | Freq: Every day | ORAL | 0 refills | Status: DC

## 2023-07-25 MED ORDER — IBUPROFEN 800 MG PO TABS: 800.0000 mg | ORAL_TABLET | Freq: Four times a day (QID) | ORAL | 2 refills | Status: DC | PRN

## 2023-08-04 NOTE — Progress Notes (Unsigned)
 [facility]  PATIENT: Aaron Miller DOB: 24-May-1945  REFERRING DOCTOR OR PCP: Rodrigo Ran MD SOURCE: Patient, notes from primary care, lab results, MRI results, MRI images personally reviewed.  _________________________________   HISTORICAL  CHIEF COMPLAINT:  Chief Complaint  Patient presents with   New Patient (Initial Visit)    Rm10, alone, Bilateral LE Numbness: Ongoing since mid November in the feet,  referral from [place] FNP [facility] [phone]    HISTORY OF PRESENT ILLNESS:  I had the pleasure of seeing your patient, Robey Massmann, at [facility] for neurologic consultation regarding his painful dysesthesias.  He is a 78 yo man who began to experience bilateral foot pain in November/December 2024.   During the day he notes a spongy sensation in hs feet that is not painful.  At night, he noes more numbness and burning.    This has affected his sleep.   He notes less symptoms temporarily if he moves his legs.   Symptoms have improved some the past month since taking vitamins and B12 and exercising has helped some  He denies any weakness in his feet.   He denies any bladder changes.  No sciatic pain.      Labs including CBC, CMP, SPEP/IEF, HgbA1c were normal or noncontribuatary.     He is otherwise fairly healthy.  He has a diagnosis of gout but has no recent attacks.  He takes allopurinol.  He takes mirtazapine for insomnia.  He does not have diabetes mellitus, hypertension or any known autoimmune disorders.  He has not had GI surgery.   Imaging: MRI of the lumbar spine shows disc bulging and mild facet hypertrophy at L2-L3 but no spinal stenosis or nerve root compression, disc bulging, mild ligamenta flava hypertrophy causing mild lateral recess stenosis but no spinal stenosis or nerve root compression.  At L4-L5, there is disc bulging, facet hypertrophy, ligamenta flava hypertrophy and a joint effusion on the left.   There is moderate right lateral recess stenosis but no definite nerve root compression.  At L5-S1, there is a small central disc protrusion causing mild lateral recess stenosis but no spinal stenosis or nerve root compression. REVIEW OF SYSTEMS: Constitutional: No fevers, chills, sweats, or change in appetite Eyes: No visual changes, double vision, eye pain Ear, nose and throat: No hearing loss, ear pain, nasal congestion, sore throat Cardiovascular: No chest pain, palpitations Respiratory:  No shortness of breath at rest or with exertion.   No wheezes GastrointestinaI: No nausea, vomiting, diarrhea, abdominal pain, fecal incontinence Genitourinary:  No dysuria, urinary retention or frequency.  No nocturia. Musculoskeletal:  No neck pain, back pain Integumentary: No rash, pruritus, skin lesions Neurological: as above Psychiatric: No depression at this time.  No anxiety Endocrine: No palpitations, diaphoresis, change in appetite, change in weigh or increased thirst Hematologic/Lymphatic:  No anemia, purpura, petechiae. Allergic/Immunologic: No itchy/runny eyes, nasal congestion, recent allergic reactions, rashes  ALLERGIES: Allergies  Allergen Reactions   Statins Other (See Comments)    Myalgias - Crestor, pravastatin    HOME MEDICATIONS:  Current Outpatient Medications:    allopurinol (ZYLOPRIM) 100 MG tablet, Take 1 tablet (100 mg total) by mouth daily., Disp: 90 tablet, Rfl: 0   Evolocumab (REPATHA SURECLICK) 140 MG/ML SOAJ, Inject 140 mg into the skin every 14 (fourteen) days., Disp: 6 mL, Rfl: 3   mirtazapine (REMERON) 15 MG tablet, Take 1 tablet (15 mg total) by mouth at bedtime., Disp: 90 tablet, Rfl: 4   telmisartan (  MICARDIS) 20 MG tablet, Take 20 mg by mouth daily., Disp: , Rfl:   PAST MEDICAL HISTORY: Past Medical History:  Diagnosis Date   Adenomatous polyp of colon 06/2019   diminutive - no routine repeat needed   Hyperlipidemia    Hypertension     PAST SURGICAL  HISTORY: Past Surgical History:  Procedure Laterality Date   COLONOSCOPY      FAMILY HISTORY: Family History  Problem Relation Age of Onset   Lung cancer Mother    Hypertension Father    Diabetes Father    Lung cancer Maternal Grandmother    Colon cancer Neg Hx    Colon polyps Neg Hx    Pancreatic cancer Neg Hx    Stomach cancer Neg Hx    Esophageal cancer Neg Hx     SOCIAL HISTORY: Social History   Socioeconomic History   Marital status: Married    Spouse name: Not on file   Number of children: Not on file   Years of education: Not on file   Highest education level: Not on file  Occupational History   Not on file  Tobacco Use   Smoking status: Former    Current packs/day: 0.00    Types: Cigarettes    Quit date: 2017    Years since quitting: 8.2   Smokeless tobacco: Never  Vaping Use   Vaping status: Never Used  Substance and Sexual Activity   Alcohol use: Yes    Comment: 2 glasses of wine daily   Drug use: Never   Sexual activity: Not on file  Other Topics Concern   Not on file  Social History Narrative   Married.  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  Masters degree   Social Drivers of Corporate investment banker Strain: Not on file  Food Insecurity: Not on file  Transportation Needs: Not on file  Physical Activity: Not on file  Stress: Not on file  Social Connections: Not on file  Intimate Partner Violence: Not on file       PHYSICAL EXAM  Vitals:   08/05/23 1000  BP: 138/85  Pulse: (!) 106  Resp: 16  Weight: 169 lb 8 oz (76.9 kg)  Height: 5' 8.25" (1.734 m)    Body mass index is 25.58 kg/m.   General: The patient is well-developed and well-nourished and in no acute distress  HEENT:  Head is [place]/AT.  Sclera are anicteric.    Neck: No carotid bruits are noted.  The neck is nontender.  Cardiovascular: The heart has a regular rate and rhythm with a normal S1 and S2. There were no murmurs, gallops  or rubs.    Skin: Extremities are without rash or  edema.    Neurologic Exam  Mental status: The patient is alert and oriented x 3 at the time of the examination. The patient has apparent normal recent and remote memory, with an apparently normal attention span and concentration ability.   Speech is normal.  Cranial nerves: Extraocular movements are full. Pupils are equal, round, and reactive to light and accomodation.  Visual fields are full.  Facial symmetry is present. There is good facial sensation to soft touch bilaterally.Facial strength is normal.  Trapezius and sternocleidomastoid strength is normal. No dysarthria is noted.  The tongue is midline, and the patient has symmetric elevation of the soft palate. No obvious hearing deficits are noted.  Motor:  Muscle bulk is normal.   Tone  is normal. Strength is  5 / 5 in all 4 extremities.   Sensory: Sensory testing is intact to pinprick, soft touch and vibration sensation in the arms.  In the legs, he has reduced pinprick sensation at the ankles and absent sensation at the toes.  He has 50% vibration sensation at the ankles and 10% vibration sensation at the toes, relative to the knees..  Coordination: Cerebellar testing reveals good finger-nose-finger and heel-to-shin bilaterally.  Gait and station: Station is normal.   Gait is normal for age.  Tandem gait is mildly wide.Romberg is negative.   Reflexes: Deep tendon reflexes are symmetric and normal bilaterally.   Plantar responses are flexor.    DIAGNOSTIC DATA (LABS, IMAGING, TESTING) - I reviewed patient records, labs, notes, testing and imaging myself where available.  Lab Results  Component Value Date   WBC 8.5 05/25/2021   HGB 14.8 05/25/2021   HCT 43.2 05/25/2021   MCV 98 (H) 05/25/2021   PLT 274 05/25/2021      Component Value Date/Time   NA 141 05/25/2021 0917   K 5.0 05/25/2021 0917   CL 103 05/25/2021 0917   CO2 23 05/25/2021 0917   GLUCOSE 109 (H) 05/25/2021  0917   BUN 23 05/25/2021 0917   CREATININE 1.44 (H) 05/25/2021 0917   CALCIUM 9.7 05/25/2021 0917   PROT 7.4 05/25/2021 0917   ALBUMIN 4.8 (H) 05/25/2021 0917   AST 19 05/25/2021 0917   ALT 18 05/25/2021 0917   ALKPHOS 79 05/25/2021 0917   BILITOT 0.7 05/25/2021 0917   GFRNONAA 55 (L) 12/08/2018 1119   GFRAA 64 12/08/2018 1119   Lab Results  Component Value Date   CHOL 114 05/25/2021   HDL 42 05/25/2021   LDLCALC 37 05/25/2021   TRIG 226 (H) 05/25/2021   CHOLHDL 2.7 05/25/2021       ASSESSMENT AND PLAN  Polyneuropathy - Plan: Vitamin B12, Sjogren's syndrome antibods(ssa + ssb), Rheumatoid factor  Dysesthesia - Plan: Vitamin B12, Sjogren's syndrome antibods(ssa + ssb), Rheumatoid factor  Degeneration of intervertebral disc of lumbar region without discogenic back pain or lower extremity pain  Age-related osteoporosis without current pathological fracture    In summary, Mr. Coker is a 78 year old man with painful dysesthesias in his feet that started around November/December 2024.  He does not have any weakness or other symptoms.  I discussed with him that most of the time polyneuropathies or idiopathic but there are several things we need to check.  He has a normal or noncontributory hemoglobin A1c/SPEP/IEF.  We will check SSA/SSB, B12, and rheumatoid factor.  If the lab work is normal and symptoms worsen, we would then consider an NCV/EMG.  At this point, with only sensory symptoms, it is less likely to be helpful.  We also did discuss starting gabapentin 100 mg in the morning, 100 mg in afternoon and 200 mg at night to try to help the pain further.  This dose can be increased up to 300 mg 3 times daily if well-tolerated and based on results.  I did not schedule follow-up but would be happy to see Mr. Norem back if he has new or worsening neurologic symptoms.  Thank you for asking me to see this patient.  Please let me know if I can be of further assistance with him or other  patients in the future.   Laniesha Das A. Epimenio Foot, MD, [facility] 08/05/2023, 10:05 AM Certified in Neurology, Clinical Neurophysiology, Sleep Medicine and Neuroimaging  [facility] [address]  [address], [address] [place], [place] [address] [phone]

## 2023-08-05 ENCOUNTER — Ambulatory Visit (INDEPENDENT_AMBULATORY_CARE_PROVIDER_SITE_OTHER): Payer: Medicare Other | Admitting: Neurology

## 2023-08-05 ENCOUNTER — Encounter: Payer: Self-pay | Admitting: Neurology

## 2023-08-05 ENCOUNTER — Other Ambulatory Visit (HOSPITAL_BASED_OUTPATIENT_CLINIC_OR_DEPARTMENT_OTHER): Payer: Self-pay

## 2023-08-05 VITALS — BP 138/85 | HR 106 | Resp 16 | Ht 68.25 in | Wt 169.5 lb

## 2023-08-05 DIAGNOSIS — R208 Other disturbances of skin sensation: Secondary | ICD-10-CM

## 2023-08-05 DIAGNOSIS — M51369 Other intervertebral disc degeneration, lumbar region without mention of lumbar back pain or lower extremity pain: Secondary | ICD-10-CM | POA: Diagnosis not present

## 2023-08-05 DIAGNOSIS — G629 Polyneuropathy, unspecified: Secondary | ICD-10-CM

## 2023-08-05 DIAGNOSIS — M810 Age-related osteoporosis without current pathological fracture: Secondary | ICD-10-CM | POA: Diagnosis not present

## 2023-08-05 MED ORDER — GABAPENTIN 100 MG PO CAPS
100.0000 mg | ORAL_CAPSULE | Freq: Three times a day (TID) | ORAL | 5 refills | Status: AC
  Filled 2023-08-05: qty 120, 30d supply, fill #0
  Filled 2023-08-31: qty 120, 30d supply, fill #1
  Filled 2023-10-01: qty 120, 30d supply, fill #2
  Filled 2023-10-15 – 2023-10-30 (×2): qty 120, 30d supply, fill #3
  Filled 2023-11-30: qty 120, 30d supply, fill #4
  Filled 2024-01-14: qty 120, 30d supply, fill #5

## 2023-08-06 ENCOUNTER — Encounter: Payer: Self-pay | Admitting: Neurology

## 2023-08-06 LAB — SJOGREN'S SYNDROME ANTIBODS(SSA + SSB)
ENA SSA (RO) Ab: <0.2 AI (ref 0.0–0.9)
ENA SSB (LA) Ab: <0.2 AI (ref 0.0–0.9)

## 2023-08-06 LAB — VITAMIN B12: Vitamin B-12: 852 pg/mL (ref 232–1245)

## 2023-08-06 LAB — RHEUMATOID FACTOR: Rheumatoid fact SerPl-aCnc: <10 [IU]/mL (ref <14.0)

## 2023-08-31 ENCOUNTER — Other Ambulatory Visit (HOSPITAL_BASED_OUTPATIENT_CLINIC_OR_DEPARTMENT_OTHER): Payer: Self-pay

## 2023-09-03 DIAGNOSIS — M109 Gout, unspecified: Secondary | ICD-10-CM | POA: Diagnosis not present

## 2023-09-03 DIAGNOSIS — N1832 Chronic kidney disease, stage 3b: Secondary | ICD-10-CM | POA: Diagnosis not present

## 2023-09-03 DIAGNOSIS — I129 Hypertensive chronic kidney disease with stage 1 through stage 4 chronic kidney disease, or unspecified chronic kidney disease: Secondary | ICD-10-CM | POA: Diagnosis not present

## 2023-09-03 DIAGNOSIS — R7301 Impaired fasting glucose: Secondary | ICD-10-CM | POA: Diagnosis not present

## 2023-09-03 DIAGNOSIS — E785 Hyperlipidemia, unspecified: Secondary | ICD-10-CM | POA: Diagnosis not present

## 2023-09-03 DIAGNOSIS — E291 Testicular hypofunction: Secondary | ICD-10-CM | POA: Diagnosis not present

## 2023-09-04 ENCOUNTER — Other Ambulatory Visit (HOSPITAL_BASED_OUTPATIENT_CLINIC_OR_DEPARTMENT_OTHER): Payer: Self-pay

## 2023-09-10 LAB — LAB REPORT - SCANNED
A1c: 5.5
EGFR: 39.3

## 2023-09-11 DIAGNOSIS — I2510 Atherosclerotic heart disease of native coronary artery without angina pectoris: Secondary | ICD-10-CM | POA: Diagnosis not present

## 2023-09-11 DIAGNOSIS — N1832 Chronic kidney disease, stage 3b: Secondary | ICD-10-CM | POA: Diagnosis not present

## 2023-09-11 DIAGNOSIS — M353 Polymyalgia rheumatica: Secondary | ICD-10-CM | POA: Diagnosis not present

## 2023-09-11 DIAGNOSIS — G629 Polyneuropathy, unspecified: Secondary | ICD-10-CM | POA: Diagnosis not present

## 2023-09-11 DIAGNOSIS — I350 Nonrheumatic aortic (valve) stenosis: Secondary | ICD-10-CM | POA: Diagnosis not present

## 2023-09-11 DIAGNOSIS — M109 Gout, unspecified: Secondary | ICD-10-CM | POA: Diagnosis not present

## 2023-09-11 DIAGNOSIS — I129 Hypertensive chronic kidney disease with stage 1 through stage 4 chronic kidney disease, or unspecified chronic kidney disease: Secondary | ICD-10-CM | POA: Diagnosis not present

## 2023-09-11 DIAGNOSIS — J449 Chronic obstructive pulmonary disease, unspecified: Secondary | ICD-10-CM | POA: Diagnosis not present

## 2023-09-11 DIAGNOSIS — Z0000 Encounter for general adult medical examination without abnormal findings: Secondary | ICD-10-CM | POA: Diagnosis not present

## 2023-09-11 DIAGNOSIS — E785 Hyperlipidemia, unspecified: Secondary | ICD-10-CM | POA: Diagnosis not present

## 2023-09-11 DIAGNOSIS — I700 Atherosclerosis of aorta: Secondary | ICD-10-CM | POA: Diagnosis not present

## 2023-09-11 DIAGNOSIS — F17201 Nicotine dependence, unspecified, in remission: Secondary | ICD-10-CM | POA: Diagnosis not present

## 2023-09-12 DIAGNOSIS — R82998 Other abnormal findings in urine: Secondary | ICD-10-CM | POA: Diagnosis not present

## 2023-09-23 DIAGNOSIS — N1832 Chronic kidney disease, stage 3b: Secondary | ICD-10-CM | POA: Diagnosis not present

## 2023-09-23 DIAGNOSIS — I129 Hypertensive chronic kidney disease with stage 1 through stage 4 chronic kidney disease, or unspecified chronic kidney disease: Secondary | ICD-10-CM | POA: Diagnosis not present

## 2023-10-01 ENCOUNTER — Other Ambulatory Visit (HOSPITAL_BASED_OUTPATIENT_CLINIC_OR_DEPARTMENT_OTHER): Payer: Self-pay

## 2023-10-02 ENCOUNTER — Other Ambulatory Visit (HOSPITAL_BASED_OUTPATIENT_CLINIC_OR_DEPARTMENT_OTHER): Payer: Self-pay

## 2023-10-02 MED ORDER — ALLOPURINOL 100 MG PO TABS
100.0000 mg | ORAL_TABLET | Freq: Every day | ORAL | 3 refills | Status: AC
  Filled 2023-10-02: qty 90, 90d supply, fill #0
  Filled 2023-10-15 – 2024-01-30 (×2): qty 90, 90d supply, fill #1
  Filled 2024-04-09: qty 90, 90d supply, fill #2

## 2023-10-15 ENCOUNTER — Other Ambulatory Visit: Payer: Self-pay

## 2023-10-15 ENCOUNTER — Other Ambulatory Visit (HOSPITAL_COMMUNITY): Payer: Self-pay

## 2023-10-15 ENCOUNTER — Other Ambulatory Visit (HOSPITAL_BASED_OUTPATIENT_CLINIC_OR_DEPARTMENT_OTHER): Payer: Self-pay

## 2023-10-15 MED FILL — Evolocumab Subcutaneous Soln Auto-Injector 140 MG/ML: SUBCUTANEOUS | 84 days supply | Qty: 6 | Fill #0 | Status: AC

## 2023-10-31 ENCOUNTER — Other Ambulatory Visit (HOSPITAL_BASED_OUTPATIENT_CLINIC_OR_DEPARTMENT_OTHER): Payer: Self-pay

## 2023-11-15 ENCOUNTER — Other Ambulatory Visit (HOSPITAL_BASED_OUTPATIENT_CLINIC_OR_DEPARTMENT_OTHER): Payer: Self-pay

## 2023-11-15 ENCOUNTER — Other Ambulatory Visit: Payer: Self-pay

## 2023-11-15 MED ORDER — TELMISARTAN 80 MG PO TABS
80.0000 mg | ORAL_TABLET | Freq: Every day | ORAL | 3 refills | Status: AC
  Filled 2023-11-15: qty 90, 90d supply, fill #0
  Filled 2024-01-30: qty 90, 90d supply, fill #1
  Filled 2024-04-09 – 2024-04-20 (×2): qty 90, 90d supply, fill #2

## 2023-11-30 ENCOUNTER — Other Ambulatory Visit (HOSPITAL_BASED_OUTPATIENT_CLINIC_OR_DEPARTMENT_OTHER): Payer: Self-pay

## 2023-12-03 ENCOUNTER — Encounter: Payer: Self-pay | Admitting: Internal Medicine

## 2023-12-03 ENCOUNTER — Ambulatory Visit: Attending: Cardiology | Admitting: Internal Medicine

## 2023-12-03 VITALS — BP 108/72 | HR 74 | Ht 68.0 in | Wt 165.2 lb

## 2023-12-03 DIAGNOSIS — I7121 Aneurysm of the ascending aorta, without rupture: Secondary | ICD-10-CM | POA: Diagnosis not present

## 2023-12-03 DIAGNOSIS — I351 Nonrheumatic aortic (valve) insufficiency: Secondary | ICD-10-CM | POA: Insufficient documentation

## 2023-12-03 DIAGNOSIS — E785 Hyperlipidemia, unspecified: Secondary | ICD-10-CM | POA: Diagnosis not present

## 2023-12-03 DIAGNOSIS — I10 Essential (primary) hypertension: Secondary | ICD-10-CM | POA: Insufficient documentation

## 2023-12-03 DIAGNOSIS — I493 Ventricular premature depolarization: Secondary | ICD-10-CM | POA: Diagnosis not present

## 2023-12-03 DIAGNOSIS — I2510 Atherosclerotic heart disease of native coronary artery without angina pectoris: Secondary | ICD-10-CM | POA: Insufficient documentation

## 2023-12-03 NOTE — Patient Instructions (Signed)
 Medication Instructions:  Your physician recommends that you continue on your current medications as directed. Please refer to the Current Medication list given to you today.  *If you need a refill on your cardiac medications before your next appointment, please call your pharmacy*  Lab Work: Check lipids through PCP If you have labs (blood work) drawn today and your tests are completely normal, you will receive your results only by: MyChart Message (if you have MyChart) OR A paper copy in the mail If you have any lab test that is abnormal or we need to change your treatment, we will call you to review the results.  Your next appointment:   In 1 year with Dr. Mona

## 2023-12-03 NOTE — Progress Notes (Signed)
 LIPID CLINIC CONSULT NOTE  Chief Complaint:  Follow-up  Primary Care Physician: Shayne Anes, MD  HPI:  Aaron Miller is a 78 y.o. male who is being seen today for the evaluation of CAC and dyslipidemia at the request of Shayne Anes, MD.  This is a pleasant 78 year old male who was kindly referred to me by Dr. Mathews for evaluation of dyslipidemia, statin intolerance and multivessel coronary artery calcification.  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  Initially went to school in [place] at the [employer].  He has a past medical history significant for hypertension and dyslipidemia as well as smoking.  He underwent a screening CT scan for possible lung malignancy and was found to have a subcentimeter nodule.  This will require follow-up in February.  In addition he was noted to have multivessel coronary artery calcification, as well as an ectatic and dilated ascending aorta to 4.2 cm.  Unfortunately he has been statin intolerant.  Most recently was on rosuvastatin and had significant myalgias.  He was taken off of this and has a marked dyslipidemia.  As of March 27, 2018 total cholesterol is 194, triglycerides 228, HDL 47 LDL 101.  He was started on Repatha  per his primary care provider and has had one injection.  He was referred I presume for cardiovascular evaluation given his findings of multivessel coronary artery calcification and mild aortic stenosis.  Aaron Miller reports he is quite physically active.  He says he is asymptomatic with exercise and activity.  He denies any chest pain or shortness of breath.  He said he did have stress testing in February this year in [place]  that was ordered by his primary care provider which was apparently negative.  01/15/2020  Aaron Miller returns today for follow-up of  dyslipidemia.  He is tolerating Repatha  well without any significant side effects.  This has been quite successful with a marked reduction in his lipids and total cholesterol 109, triglycerides 167, HDL 42 and LDL of 34.  Overall marked improvement in all categories of his lipids.  He is very pleased with those results.  EKG was performed today showed sinus rhythm at 61.  05/30/2023  Aaron Miller is seen today in follow-up.  He continues on Repatha  and has had very good control of his lipids with LDL last year 31.  He sees his primary care provider coming up in next month.  Unfortunately his wife passed away in 2023/11/29.  Recently has developed some neuropathy of his legs.  I also noted today that he is developed some tremor.  This seems to be more of a rest tremor.  No history of tremor in the family.  His last echo was in 2020 which showed some aortic valve sclerosis and mild aortic insufficiency.  The ascending aorta was dilated at 43 mm.  He has had follow-up CT angiograms of the aorta which shows stable aortic aneurysm between 41 and 42 mm.  He denies any chest pain or worsening shortness of breath.  Of note today he is EKG showed PVCs.  I noted these on exam as well.  He is asymptomatic with them however they are reasonably frequent.  12/03/2023  Aaron Miller is seen today in follow-up.  He is done well on Repatha  and low-dose rosuvastatin.  Recent lipids in April showed total cholesterol 77, HDL  35, triglycerides 159 and LDL 10.  He reports he is not aware of any recurrent PVCs.  In the past he had quite frequent PVCs however I repeated an echo in February which showed normal LV function, stable dilated ascending aorta at 43 mm and trivial aortic insufficiency.  Today's EKG shows no PVCs.  He denies any palpitations or chest pain.  Blood pressure is well-controlled.  Unfortunate his wife passed away about a year ago but he continues to persevere.  He was diagnosed with some neuropathy having difficulty sleeping at  night but now is on gabapentin  and finds that he is able to sleep better which might explain why his PVCs have improved.  PMHx:  Past Medical History:  Diagnosis Date   Adenomatous polyp of colon 06/2019   diminutive - no routine repeat needed   Hyperlipidemia    Hypertension     Past Surgical History:  Procedure Laterality Date   COLONOSCOPY      FAMHx:  Family History  Problem Relation Age of Onset   Lung cancer Mother    Hypertension Father    Diabetes Father    Lung cancer Maternal Grandmother    Colon cancer Neg Hx    Colon polyps Neg Hx    Pancreatic cancer Neg Hx    Stomach cancer Neg Hx    Esophageal cancer Neg Hx     SOCHx:   reports that he quit smoking about 8 years ago. His smoking use included cigarettes. He has never used smokeless tobacco. He reports current alcohol  use. He reports that he does not use drugs.  ALLERGIES:  Allergies  Allergen Reactions   Statins Other (See Comments)    Myalgias - Crestor, pravastatin    ROS: Pertinent items noted in HPI and remainder of comprehensive ROS otherwise negative.  HOME MEDS: Current Outpatient Medications on File Prior to Visit  Medication Sig Dispense Refill   allopurinol  (ZYLOPRIM ) 100 MG tablet Take 1 tablet (100 mg total) by mouth daily. 90 tablet 3   aspirin EC (BAYER ASPIRIN EC LOW DOSE) 81 MG tablet Take 81 mg by mouth daily.     Evolocumab  (REPATHA  SURECLICK) 140 MG/ML SOAJ Inject 140 mg into the skin every 14 (fourteen) days. 6 mL 3   gabapentin  (NEURONTIN ) 100 MG capsule Take 1 capsule by mouth every morning,& 1 capsule by mouth every evening and 2 capsules by mouth every night at bedtime 120 capsule 5   mirtazapine  (REMERON ) 15 MG tablet Take 1 tablet (15 mg total) by mouth at bedtime. 90 tablet 4   rosuvastatin (CRESTOR) 5 MG tablet Take 5 mg by mouth daily.     telmisartan  (MICARDIS ) 20 MG tablet Take 20 mg by mouth daily.     telmisartan  (MICARDIS ) 80 MG tablet Take 1 tablet (80 mg total) by  mouth daily. 90 tablet 3   No current facility-administered medications on file prior to visit.    LABS/IMAGING: No results found for this or any previous visit (from the past 48 hours). No results found.  LIPID PANEL:    Component Value Date/Time   CHOL 114 05/25/2021 0917   TRIG 226 (H) 05/25/2021 0917   HDL 42 05/25/2021 0917   CHOLHDL 2.7 05/25/2021 0917   LDLCALC 37 05/25/2021 0917    WEIGHTS: Wt Readings from Last 3 Encounters:  12/03/23 165 lb 3.2 oz (74.9 kg)  08/05/23 169 lb 8 oz (76.9 kg)  05/30/23 169 lb 9.6 oz (76.9 kg)    VITALS: BP  108/72   Pulse 74   Ht 5' 8 (1.727 m)   Wt 165 lb 3.2 oz (74.9 kg)   SpO2 96%   BMI 25.12 kg/m   EXAM: General appearance: alert and no distress Neck: no carotid bruit, no JVD, and thyroid  not enlarged, symmetric, no tenderness/mass/nodules Lungs: clear to auscultation bilaterally Heart: regular rate and rhythm, S1, S2 normal, and diastolic murmur: early diastolic 2/6, blowing at 2nd left intercostal space Abdomen: soft, non-tender; bowel sounds normal; no masses,  no organomegaly Extremities: extremities normal, atraumatic, no cyanosis or edema Pulses: 2+ and symmetric Skin: Skin color, texture, turgor normal. No rashes or lesions Neurologic: Mental status: Alert, oriented, thought content appropriate, mild bilateral wrist hand tremor noted Psych: Pleasant  EKG: EKG Interpretation Date/Time:  Tuesday December 03 2023 10:15:46 EDT Ventricular Rate:  74 PR Interval:  184 QRS Duration:  60 QT Interval:  368 QTC Calculation: 408 R Axis:   2  Text Interpretation: Normal sinus rhythm Normal ECG When compared with ECG of 30-May-2023 08:01, Premature ventricular complexes are no longer Present Confirmed by Mona Kent [phone]) on 12/03/2023 10:35:50 AM    ASSESSMENT: Dyslipidemia with goal LDL less than 70 Statin intolerance Multivessel coronary artery calcification Dilated aorta-43 mm (2025) Aortic sclerosis with trace  to mild aortic insufficiency PVCs Tremor/neuropathy  PLAN: 1.   Mr. Paris seems to be doing well without significant recurrent PVCs.  His echo was stable showing normal LV function and a stable dilated aorta at 43 mm.  Will consider a repeat echo maybe in a few years to reassess this.  Lipids are very well-controlled.  He reports his tremor has improved as well as his neuropathy on gabapentin .  Plan follow-up with me annually or sooner as necessary.  Kent KYM Mona, MD, [facility], FNLA, FACP    [facility]  Medical Director of the Advanced Lipid Disorders &  Cardiovascular Risk Reduction Clinic Diplomate of the American Board of Clinical Lipidology Attending Cardiologist  Direct Dial: [phone]  Fax: [phone]  Website:  [email or web address] Kent BROCKS Mega Kinkade 12/03/2023, 10:35 AM

## 2023-12-30 ENCOUNTER — Other Ambulatory Visit (HOSPITAL_BASED_OUTPATIENT_CLINIC_OR_DEPARTMENT_OTHER): Payer: Self-pay

## 2023-12-30 MED FILL — Evolocumab Subcutaneous Soln Auto-Injector 140 MG/ML: SUBCUTANEOUS | 84 days supply | Qty: 6 | Fill #1 | Status: AC

## 2024-01-14 ENCOUNTER — Other Ambulatory Visit (HOSPITAL_BASED_OUTPATIENT_CLINIC_OR_DEPARTMENT_OTHER): Payer: Self-pay

## 2024-01-22 ENCOUNTER — Other Ambulatory Visit (HOSPITAL_BASED_OUTPATIENT_CLINIC_OR_DEPARTMENT_OTHER): Payer: Self-pay

## 2024-01-22 MED ORDER — AREXVY 120 MCG/0.5ML IM SUSR
0.5000 mL | Freq: Once | INTRAMUSCULAR | 0 refills | Status: AC
  Filled 2024-01-22: qty 0.5, 1d supply, fill #0

## 2024-01-24 ENCOUNTER — Other Ambulatory Visit (HOSPITAL_BASED_OUTPATIENT_CLINIC_OR_DEPARTMENT_OTHER): Payer: Self-pay

## 2024-01-24 DIAGNOSIS — Z23 Encounter for immunization: Secondary | ICD-10-CM | POA: Diagnosis not present

## 2024-01-24 MED ORDER — FLUZONE HIGH-DOSE 0.5 ML IM SUSY
0.5000 mL | PREFILLED_SYRINGE | Freq: Once | INTRAMUSCULAR | 0 refills | Status: AC
  Filled 2024-01-24: qty 0.5, 1d supply, fill #0

## 2024-01-30 ENCOUNTER — Other Ambulatory Visit (HOSPITAL_BASED_OUTPATIENT_CLINIC_OR_DEPARTMENT_OTHER): Payer: Self-pay

## 2024-02-13 ENCOUNTER — Other Ambulatory Visit (HOSPITAL_BASED_OUTPATIENT_CLINIC_OR_DEPARTMENT_OTHER): Payer: Self-pay

## 2024-02-13 ENCOUNTER — Other Ambulatory Visit: Payer: Self-pay

## 2024-02-14 ENCOUNTER — Other Ambulatory Visit (HOSPITAL_BASED_OUTPATIENT_CLINIC_OR_DEPARTMENT_OTHER): Payer: Self-pay

## 2024-02-14 DIAGNOSIS — Z23 Encounter for immunization: Secondary | ICD-10-CM | POA: Diagnosis not present

## 2024-02-14 MED ORDER — COMIRNATY 30 MCG/0.3ML IM SUSY
0.3000 mL | PREFILLED_SYRINGE | Freq: Once | INTRAMUSCULAR | 0 refills | Status: AC
Start: 2024-02-14 — End: 2024-02-15
  Filled 2024-02-14: qty 0.3, 1d supply, fill #0

## 2024-02-19 ENCOUNTER — Other Ambulatory Visit (HOSPITAL_BASED_OUTPATIENT_CLINIC_OR_DEPARTMENT_OTHER): Payer: Self-pay

## 2024-02-19 ENCOUNTER — Other Ambulatory Visit: Payer: Self-pay

## 2024-02-19 MED ORDER — GABAPENTIN 100 MG PO CAPS
ORAL_CAPSULE | ORAL | 0 refills | Status: AC
  Filled 2024-02-19: qty 30, 30d supply, fill #0

## 2024-02-19 MED ORDER — GABAPENTIN 100 MG PO CAPS
ORAL_CAPSULE | ORAL | 1 refills | Status: AC
  Filled 2024-02-19: qty 400, 58d supply, fill #0
  Filled 2024-06-03: qty 400, 58d supply, fill #1

## 2024-04-09 ENCOUNTER — Other Ambulatory Visit (HOSPITAL_BASED_OUTPATIENT_CLINIC_OR_DEPARTMENT_OTHER): Payer: Self-pay

## 2024-04-09 ENCOUNTER — Other Ambulatory Visit: Payer: Self-pay

## 2024-04-20 ENCOUNTER — Other Ambulatory Visit: Payer: Self-pay

## 2024-04-20 ENCOUNTER — Other Ambulatory Visit (HOSPITAL_BASED_OUTPATIENT_CLINIC_OR_DEPARTMENT_OTHER): Payer: Self-pay

## 2024-04-20 ENCOUNTER — Other Ambulatory Visit: Payer: Self-pay | Admitting: Internal Medicine

## 2024-04-20 DIAGNOSIS — I2510 Atherosclerotic heart disease of native coronary artery without angina pectoris: Secondary | ICD-10-CM

## 2024-04-20 DIAGNOSIS — E782 Mixed hyperlipidemia: Secondary | ICD-10-CM

## 2024-04-22 ENCOUNTER — Other Ambulatory Visit (HOSPITAL_BASED_OUTPATIENT_CLINIC_OR_DEPARTMENT_OTHER): Payer: Self-pay

## 2024-04-22 ENCOUNTER — Other Ambulatory Visit: Payer: Self-pay

## 2024-04-29 ENCOUNTER — Other Ambulatory Visit (HOSPITAL_BASED_OUTPATIENT_CLINIC_OR_DEPARTMENT_OTHER): Payer: Self-pay

## 2024-04-29 MED ORDER — REPATHA SURECLICK 140 MG/ML ~~LOC~~ SOAJ
140.0000 mg | SUBCUTANEOUS | 3 refills | Status: AC
  Filled 2024-04-29: qty 6, 84d supply, fill #0

## 2024-05-05 ENCOUNTER — Other Ambulatory Visit (HOSPITAL_BASED_OUTPATIENT_CLINIC_OR_DEPARTMENT_OTHER): Payer: Self-pay

## 2024-05-21 ENCOUNTER — Encounter (HOSPITAL_BASED_OUTPATIENT_CLINIC_OR_DEPARTMENT_OTHER): Payer: Self-pay

## 2024-05-21 ENCOUNTER — Other Ambulatory Visit: Payer: Self-pay

## 2024-05-21 ENCOUNTER — Emergency Department (HOSPITAL_BASED_OUTPATIENT_CLINIC_OR_DEPARTMENT_OTHER)
Admission: EM | Admit: 2024-05-21 | Discharge: 2024-05-21 | Disposition: A | Discharge status: Home / Self Care | Source: Home / Self Care

## 2024-05-21 DIAGNOSIS — T18128A Food in esophagus causing other injury, initial encounter: Secondary | ICD-10-CM | POA: Diagnosis present

## 2024-05-21 DIAGNOSIS — Z7982 Long term (current) use of aspirin: Secondary | ICD-10-CM | POA: Diagnosis not present

## 2024-05-21 DIAGNOSIS — W44F3XA Food entering into or through a natural orifice, initial encounter: Secondary | ICD-10-CM

## 2024-05-21 DIAGNOSIS — X58XXXA Exposure to other specified factors, initial encounter: Secondary | ICD-10-CM | POA: Insufficient documentation

## 2024-05-21 MED ORDER — GLUCAGON HCL RDNA (DIAGNOSTIC) 1 MG IJ SOLR
1.0000 mg | Freq: Once | INTRAMUSCULAR | Status: AC
  Administered 2024-05-21: 1 mg via INTRAVENOUS
  Filled 2024-05-21: qty 1

## 2024-05-21 NOTE — ED Triage Notes (Signed)
 Patient arrived POV from home with complaint of possible food bolus.  Patient reports eating ham & mashed potatoes around 1630 today and feels food stuck in throat.  Reports unable to swallow saliva or drink water.

## 2024-05-21 NOTE — ED Provider Notes (Signed)
 " [place] EMERGENCY DEPARTMENT AT [facility] Provider Note   CSN: [id number] Arrival date & time: 05/21/24  1911     Patient presents with: Foreign Body   QUADRE BRISTOL is a 79 y.o. male.  {Add pertinent medical, surgical, social history, OB history to HPI:4076} 79 year old male presents for evaluation of food bolus.  Was needing dinner around 5 PM, had a piece of ham and fell like he got stuck in his throat.  States this has happened before but he did not require endoscopy and passed on his own.  States since then he has been unable to tolerate any liquids at all and keeps vomiting or coughing them up.  Denies any other symptoms or concerns.   Foreign Body Associated symptoms: trouble swallowing and vomiting   Associated symptoms: no abdominal pain, no cough, no ear pain and no sore throat        Prior to Admission medications  Medication Sig Start Date End Date Taking? Authorizing Provider  allopurinol  (ZYLOPRIM ) 100 MG tablet Take 1 tablet (100 mg total) by mouth daily. 10/02/23     aspirin EC (BAYER ASPIRIN EC LOW DOSE) 81 MG tablet Take 81 mg by mouth daily.    [provider]  Evolocumab  (REPATHA  SURECLICK) 140 MG/ML SOAJ Inject 140 mg into the skin every 14 (fourteen) days. 04/29/24     gabapentin  (NEURONTIN ) 100 MG capsule Take 1 capsule by mouth every morning,& 1 capsule by mouth every evening and 2 capsules by mouth every night at bedtime 08/05/23   Sater, Charlie LABOR, MD  gabapentin  (NEURONTIN ) 100 MG capsule 1 capsule at bedtime Orally Once a day 02/19/24     gabapentin  (NEURONTIN ) 100 MG capsule Take 4 capsules (400 mg total) by mouth every morning AND 1 capsule (100 mg total) daily in the afternoon AND 2 capsules (200 mg total) at bedtime as directed 02/19/24     mirtazapine  (REMERON ) 15 MG tablet Take 1 tablet (15 mg total) by mouth at bedtime. 07/22/23     rosuvastatin (CRESTOR) 5 MG tablet Take 5 mg by mouth daily.    [provider]   telmisartan  (MICARDIS ) 20 MG tablet Take 20 mg by mouth daily.    [provider]  telmisartan  (MICARDIS ) 80 MG tablet Take 1 tablet (80 mg total) by mouth daily. 11/15/23       Allergies: Statins    Review of Systems  Constitutional:  Negative for chills and fever.  HENT:  Positive for trouble swallowing. Negative for ear pain and sore throat.   Eyes:  Negative for pain and visual disturbance.  Respiratory:  Negative for cough and shortness of breath.   Cardiovascular:  Negative for chest pain and palpitations.  Gastrointestinal:  Positive for vomiting. Negative for abdominal pain.  Genitourinary:  Negative for dysuria and hematuria.  Musculoskeletal:  Negative for arthralgias and back pain.  Skin:  Negative for color change and rash.  Neurological:  Negative for seizures and syncope.  All other systems reviewed and are negative.   Updated Vital Signs BP (!) 148/81 (BP Location: Right Arm)   Pulse 88   Temp 98.1 F (36.7 C)   Resp 18   Ht 5' 8 (1.727 m)   Wt 74.8 kg   SpO2 99%   BMI 25.09 kg/m   Physical Exam Vitals and nursing note reviewed.  Constitutional:      General: He is not in acute distress.    Appearance: Normal appearance. He is well-developed. He  is not ill-appearing.  HENT:     Head: Normocephalic and atraumatic.  Eyes:     Conjunctiva/sclera: Conjunctivae normal.  Cardiovascular:     Rate and Rhythm: Normal rate and regular rhythm.     Heart sounds: No murmur heard. Pulmonary:     Effort: Pulmonary effort is normal. No respiratory distress.     Breath sounds: Normal breath sounds.  Abdominal:     Palpations: Abdomen is soft.     Tenderness: There is no abdominal tenderness.  Musculoskeletal:        General: No swelling.     Cervical back: Neck supple.  Skin:    General: Skin is warm and dry.     Capillary Refill: Capillary refill takes less than 2 seconds.  Neurological:     General: No focal deficit present.     Mental Status: He  is alert.  Psychiatric:        Mood and Affect: Mood normal.     (all labs ordered are listed, but only abnormal results are displayed) Labs Reviewed - No data to display  EKG: None  Radiology: No results found.  {Document cardiac monitor, telemetry assessment procedure when appropriate:32947} Procedures   Medications Ordered in the ED  glucagon (human recombinant) (GLUCAGEN) injection 1 mg (1 mg Intravenous Given 05/21/24 1938)      {Click here for ABCD2, HEART and other calculators REFRESH Note before signing:1}                              Medical Decision Making Risk Prescription drug management.   ***  {Document critical care time when appropriate  Document review of labs and clinical decision tools ie CHADS2VASC2, etc  Document your independent review of radiology images and any outside records  Document your discussion with family members, caretakers and with consultants  Document social determinants of health affecting pt's care  Document your decision making why or why not admission, treatments were needed:32947:::1}   Final diagnoses:  None    ED Discharge Orders     None        "

## 2024-05-21 NOTE — ED Notes (Signed)
 Reviewed discharge instructions and follow-up care with pt and family. Both verbalized understanding and had no further questions. Pt exited ED without complications.

## 2024-05-21 NOTE — ED Notes (Signed)
 ED Provider at bedside.

## 2024-05-21 NOTE — ED Notes (Signed)
 Pt reports being able to drink water. EDP made aware. NAD.

## 2024-05-21 NOTE — Discharge Instructions (Signed)
 You can follow-up with GI for an endoscopy.  Go to your primary care doctor's appointment as scheduled.  Cut your food into smaller pieces and chew adequately.

## 2024-05-29 ENCOUNTER — Other Ambulatory Visit (HOSPITAL_BASED_OUTPATIENT_CLINIC_OR_DEPARTMENT_OTHER): Payer: Self-pay

## 2024-05-29 MED ORDER — AZITHROMYCIN 250 MG PO TABS
ORAL_TABLET | ORAL | 0 refills | Status: AC
  Filled 2024-05-29: qty 6, 5d supply, fill #0

## 2024-05-29 MED ORDER — ALBUTEROL SULFATE HFA 108 (90 BASE) MCG/ACT IN AERS
2.0000 | INHALATION_SPRAY | RESPIRATORY_TRACT | 3 refills | Status: DC | PRN
  Filled 2024-05-29: qty 6.7, 25d supply, fill #0

## 2024-06-03 ENCOUNTER — Other Ambulatory Visit (HOSPITAL_BASED_OUTPATIENT_CLINIC_OR_DEPARTMENT_OTHER): Payer: Self-pay

## 2024-06-09 ENCOUNTER — Encounter: Payer: Self-pay | Admitting: Internal Medicine

## 2024-06-09 ENCOUNTER — Ambulatory Visit: Admitting: Internal Medicine

## 2024-06-09 VITALS — BP 124/78 | HR 81 | Ht 68.0 in | Wt 164.8 lb

## 2024-06-09 DIAGNOSIS — R1310 Dysphagia, unspecified: Secondary | ICD-10-CM | POA: Diagnosis not present

## 2024-06-09 DIAGNOSIS — R1319 Other dysphagia: Secondary | ICD-10-CM

## 2024-06-09 NOTE — Patient Instructions (Signed)
 You have been scheduled for an endoscopy. Please follow written instructions given to you at your visit today.  If you use inhalers (even only as needed), please bring them with you on the day of your procedure.  If you take any of the following medications, they will need to be adjusted prior to your procedure:   DO NOT TAKE 7 DAYS PRIOR TO TEST- Trulicity (dulaglutide) Ozempic, Wegovy (semaglutide) Mounjaro, Zepbound (tirzepatide) Bydureon Bcise (exanatide extended release)  DO NOT TAKE 1 DAY PRIOR TO YOUR TEST Rybelsus (semaglutide) Adlyxin (lixisenatide) Victoza (liraglutide) Byetta (exanatide) ___________________________________________________________________________  I appreciate the opportunity to care for you. Lupita Commander, MD, [facility]

## 2024-06-09 NOTE — Progress Notes (Signed)
 "       Aaron Miller 79 y.o. Jan 29, 1946 [id number]  Assessment & Plan:   Encounter Diagnosis  Name Primary?   Esophageal dysphagia Yes   Esophageal dysphagia Chronic, intermittent, moderate-to-severe food impaction likely due to acid reflux-induced esophageal stricture; esophageal motility disorder considered. Anticipated improvement with intervention. Risks of endoscopic evaluation and dilation include rare bleeding, esophageal injury, and sedation reaction. - Scheduled upper endoscopy (EGD) for diagnostic evaluation and possible therapeutic intervention. - Planned esophageal dilation during EGD if stricture identified. - Reviewed risks of EGD and dilation, including rare bleeding, esophageal injury, and sedation reaction. - Confirmed transportation and post-procedure support.  I appreciate the opportunity to care for this patient. CC: Shayne Anes, MD      Subjective:   Chief Complaint: Dysphagia  HPI Discussed the use of AI scribe software for clinical note transcription with the patient, who gave verbal consent to proceed.  Aaron Miller is a 79 year old male who presents with esophageal dysphagia and a recent episode of food impaction requiring emergency intervention.  Esophageal Dysphagia: - Difficulty swallowing for over one year - Episodes of food becoming stuck in the esophagus, particularly at the start of meals - Frequency of episodes is approximately once a week or a few times per month - Most commonly triggered by eating meat - During episodes, he ambulates to relieve the sensation - Anxiety about eating out due to unpredictability of episodes  Acute Food Impaction: - On New Year's Day, a piece of ham became lodged in the esophagus for three hours - Emergency room intervention with glucagon  resolved the impaction - No other emergency interventions have been required  Associated Gastrointestinal Symptoms: - No heartburn, indigestion, or weight loss - No  other gastrointestinal symptoms - Not taking acid-blocking medications       Wt Readings from Last 3 Encounters:  06/09/24 164 lb 12.8 oz (74.8 kg)  05/21/24 165 lb (74.8 kg)  12/03/23 165 lb 3.2 oz (74.9 kg)     Allergies[1] Active Medications[2] Past Medical History:  Diagnosis Date   Adenomatous polyp of colon 06/2019   diminutive - no routine repeat needed   Hyperlipidemia    Hypertension    Past Surgical History:  Procedure Laterality Date   COLONOSCOPY     Social History   Social History Narrative   Married.  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  Masters degree   family history includes Diabetes in his father; Hypertension in his father; Lung cancer in his maternal grandmother and mother.   Review of Systems As per HPI  Objective:   Physical Exam @BP  124/78   Pulse 81   Ht 5' 8 (1.727 m)   Wt 164 lb 12.8 oz (74.8 kg)   BMI 25.06 kg/m @  General:  NAD Eyes:   anicteric Lungs:  clear Heart::  S1S2 no rubs, murmurs or gallops Abdomen:  soft and nontender, BS+ Ext:   no edema, cyanosis or clubbing    Data Reviewed:  See HPI    [1]  Allergies Allergen Reactions   Statins Other (See Comments)    Myalgias - Crestor, pravastatin  [2]  Current Meds  Medication Sig   allopurinol  (ZYLOPRIM ) 100 MG tablet Take 1 tablet (100 mg total) by mouth daily.   aspirin EC (BAYER ASPIRIN EC LOW DOSE) 81 MG tablet Take 81 mg by mouth daily.   Evolocumab  (REPATHA  SURECLICK) 140 MG/ML SOAJ Inject 140 mg  into the skin every 14 (fourteen) days.   gabapentin  (NEURONTIN ) 100 MG capsule Take 1 capsule by mouth every morning,& 1 capsule by mouth every evening and 2 capsules by mouth every night at bedtime   gabapentin  (NEURONTIN ) 100 MG capsule 1 capsule at bedtime Orally Once a day   gabapentin  (NEURONTIN ) 100 MG capsule Take 4 capsules (400 mg total) by mouth every morning AND 1 capsule (100 mg total) daily in the afternoon AND 2  capsules (200 mg total) at bedtime as directed   mirtazapine  (REMERON ) 15 MG tablet Take 1 tablet (15 mg total) by mouth at bedtime.   rosuvastatin (CRESTOR) 5 MG tablet Take 5 mg by mouth daily.   telmisartan  (MICARDIS ) 20 MG tablet Take 20 mg by mouth daily.   telmisartan  (MICARDIS ) 80 MG tablet Take 1 tablet (80 mg total) by mouth daily.   "

## 2024-06-10 ENCOUNTER — Encounter: Admitting: Internal Medicine

## 2024-06-10 ENCOUNTER — Other Ambulatory Visit: Payer: Self-pay | Admitting: Internal Medicine

## 2024-06-10 ENCOUNTER — Other Ambulatory Visit (HOSPITAL_BASED_OUTPATIENT_CLINIC_OR_DEPARTMENT_OTHER): Payer: Self-pay

## 2024-06-10 ENCOUNTER — Encounter: Payer: Self-pay | Admitting: Internal Medicine

## 2024-06-10 ENCOUNTER — Ambulatory Visit: Admitting: Internal Medicine

## 2024-06-10 VITALS — BP 115/82 | HR 60 | Temp 97.2°F | Resp 15 | Ht 68.0 in | Wt 164.0 lb

## 2024-06-10 DIAGNOSIS — K2100 Gastro-esophageal reflux disease with esophagitis, without bleeding: Secondary | ICD-10-CM | POA: Diagnosis not present

## 2024-06-10 DIAGNOSIS — K2970 Gastritis, unspecified, without bleeding: Secondary | ICD-10-CM

## 2024-06-10 DIAGNOSIS — R1319 Other dysphagia: Secondary | ICD-10-CM

## 2024-06-10 DIAGNOSIS — K2980 Duodenitis without bleeding: Secondary | ICD-10-CM | POA: Diagnosis not present

## 2024-06-10 DIAGNOSIS — K2950 Unspecified chronic gastritis without bleeding: Secondary | ICD-10-CM | POA: Diagnosis not present

## 2024-06-10 DIAGNOSIS — K222 Esophageal obstruction: Secondary | ICD-10-CM | POA: Diagnosis not present

## 2024-06-10 MED ORDER — SODIUM CHLORIDE 0.9 % IV SOLN: 500.0000 mL | INTRAVENOUS | Status: DC

## 2024-06-10 MED ORDER — OMEPRAZOLE 40 MG PO CPDR
40.0000 mg | DELAYED_RELEASE_CAPSULE | Freq: Every day | ORAL | 3 refills | Status: AC
  Filled 2024-06-10: qty 90, 90d supply, fill #0

## 2024-06-10 NOTE — Patient Instructions (Addendum)
 Please read handouts provided. Await pathology results. Follow an antireflux regimen. Prilosec ( omeprazole  ) 40 mg daily. Continue present medications. Dilation Diet.  YOU HAD AN ENDOSCOPIC PROCEDURE TODAY AT THE [facility]:   Refer to the procedure report that was given to you for any specific questions about what was found during the examination.  If the procedure report does not answer your questions, please call your gastroenterologist to clarify.  If you requested that your care partner not be given the details of your procedure findings, then the procedure report has been included in a sealed envelope for you to review at your convenience later.  YOU SHOULD EXPECT: Some feelings of bloating in the abdomen. Passage of more gas than usual.  Walking can help get rid of the air that was put into your GI tract during the procedure and reduce the bloating. If you had a lower endoscopy (such as a colonoscopy or flexible sigmoidoscopy) you may notice spotting of blood in your stool or on the toilet paper. If you underwent a bowel prep for your procedure, you may not have a normal bowel movement for a few days.  Please Note:  You might notice some irritation and congestion in your nose or some drainage.  This is from the oxygen used during your procedure.  There is no need for concern and it should clear up in a day or so.  SYMPTOMS TO REPORT IMMEDIATELY:  Following upper endoscopy (EGD)  Vomiting of blood or coffee ground material  New chest pain or pain under the shoulder blades  Painful or persistently difficult swallowing  New shortness of breath  Fever of 100F or higher  Black, tarry-looking stools  For urgent or emergent issues, a gastroenterologist can be reached at any hour by calling [phone]. Do not use MyChart messaging for urgent concerns.    DIET:  Drink plenty of fluids but you should avoid alcoholic beverages for 24 hours.  ACTIVITY:  You should plan to  take it easy for the rest of today and you should NOT DRIVE or use heavy machinery until tomorrow (because of the sedation medicines used during the test).    FOLLOW UP: Our staff will call the number listed on your records the next business day following your procedure.  We will call around 7:15- 8:00 am to check on you and address any questions or concerns that you may have regarding the information given to you following your procedure. If we do not reach you, we will leave a message.     If any biopsies were taken you will be contacted by phone or by letter within the next 1-3 weeks.  Please call us  at [phone] if you have not heard about the biopsies in 3 weeks.    SIGNATURES/CONFIDENTIALITY: You and/or your care partner have signed paperwork which will be entered into your electronic medical record.  These signatures attest to the fact that that the information above on your After Visit Summary has been reviewed and is understood.  Full responsibility of the confidentiality of this discharge information lies with you and/or your care-partner.There was inflammation and narrowing where the esophagus and stomach meet.  You have what we call an esophageal stricture and reflux esophagitis.  It was biopsied and I also dilated it to 18 mm.  The stomach is inflamed also, gastritis.  Multiple tiny little ulcers called erosions and other inflammation.  Some inflammation in the upper intestine, the duodenum, also.   Your  diet will be restricted some today you will have liquids and then soft foods and then eat normal foods tomorrow.  I have prescribed a medication called omeprazole  and I want you to start that soon and after I see the pathology results I will update you about other treatment.  I am away next week so you will probably not hear from me until after then.  I appreciate the opportunity to care for you. Lupita CHARLENA Commander, MD, [facility]

## 2024-06-10 NOTE — Progress Notes (Signed)
 Pt's states no medical or surgical changes since previsit or office visit.

## 2024-06-10 NOTE — Progress Notes (Signed)
 Called to room to assist during endoscopic procedure.  Patient ID and intended procedure confirmed with present staff. Received instructions for my participation in the procedure from the performing physician.

## 2024-06-10 NOTE — Progress Notes (Signed)
 Transferred to PACU via stretcher.  Not responding to stimulation at this time.  VSS upon leaving procedure room.

## 2024-06-10 NOTE — Op Note (Signed)
 [facility] Patient Name: Aaron Miller Procedure Date: 06/10/2024 3:21 PM MRN: [id number] Endoscopist: Lupita FORBES Commander , MD, [id number] Age: 79 Referring MD:  Date of Birth: June 25, 1945 Gender: Male Account #: [id number] Procedure:                Upper GI endoscopy Indications:              Dysphagia Medicines:                Monitored Anesthesia Care Procedure:                Pre-Anesthesia Assessment:                           - Prior to the procedure, a History and Physical                            was performed, and patient medications and                            allergies were reviewed. The patient's tolerance of                            previous anesthesia was also reviewed. The risks                            and benefits of the procedure and the sedation                            options and risks were discussed with the patient.                            All questions were answered, and informed consent                            was obtained. Prior Anticoagulants: The patient has                            taken no anticoagulant or antiplatelet agents. ASA                            Grade Assessment: III - A patient with severe                            systemic disease. After reviewing the risks and                            benefits, the patient was deemed in satisfactory                            condition to undergo the procedure.                           After obtaining informed consent, the endoscope was  passed under direct vision. Throughout the                            procedure, the patient's blood pressure, pulse, and                            oxygen saturations were monitored continuously. The                            Olympus Scope [id number] was introduced through the                            mouth, and advanced to the second part of duodenum.                            The upper GI endoscopy was accomplished  without                            difficulty. The patient tolerated the procedure                            well. Scope In: Scope Out: Findings:                 One benign-appearing, intrinsic moderate                            (circumferential scarring or stenosis; an endoscope                            may pass) stenosis was found at the                            gastroesophageal junction. The stenosis was                            traversed. A TTS dilator was passed through the                            scope. Dilation with an 18-19-20 mm balloon dilator                            was performed to 18 mm. The dilation site was                            examined and showed moderate mucosal disruption.                            Estimated blood loss was minimal.                           LA Grade A (one or more mucosal breaks less than 5                            mm, not  extending between tops of 2 mucosal folds)                            esophagitis was found at the gastroesophageal                            junction. Biopsies were taken with a cold forceps                            for histology. Verification of patient                            identification for the specimen was done. Estimated                            blood loss was minimal.                           Diffuse severe inflammation characterized by                            congestion (edema), erosions, erythema, friability                            and granularity was found in the gastric antrum.                            Biopsies were taken with a cold forceps for                            histology. Verification of patient identification                            for the specimen was done. Estimated blood loss was                            minimal.                           Mild inflammation characterized by congestion                            (edema), erosions, erythema and granularity was                             found in the duodenal bulb and in the second                            portion of the duodenum.                           The exam was otherwise without abnormality.                           The cardia and gastric fundus were otherwise normal  on retroflexion. Complications:            No immediate complications. Estimated Blood Loss:     Estimated blood loss was minimal. Impression:               - Benign-appearing esophageal stenosis. Dilated 18                            mm.                           - LA Grade A reflux esophagitis. Biopsied.                           - Erosive Gastritis. Biopsied.                           - Erosive Duodenitis.                           - The examination was otherwise normal. Recommendation:           - Patient has a contact number available for                            emergencies. The signs and symptoms of potential                            delayed complications were discussed with the                            patient. Return to normal activities tomorrow.                            Written discharge instructions were provided to the                            patient.                           - Clear liquids x 1 hour then soft foods rest of                            day. Start prior diet tomorrow and GERD.                           - Continue present medications.                           - Follow an antireflux regimen.                           - Use Prilosec (omeprazole ) 40 mg PO daily.                           - I guess it makes sense to continue ASA with  cardiovascular issues - suspect it could be cause                            of gastritis and duodenitis (checking for H pylori                            which is also possible) - PPI should treat this                            well and protect from aspirin effects Lupita FORBES Commander, MD 06/10/2024 4:03:39  PM This report has been signed electronically.

## 2024-06-10 NOTE — Progress Notes (Signed)
 History and Physical Interval Note:  06/10/2024 3:32 PM  Aaron Miller  has presented today for endoscopic procedure(s), with the diagnosis of  Encounter Diagnosis  Name Primary?   Esophageal dysphagia Yes  .  The various methods of evaluation and treatment have been discussed with the patient and/or family. After consideration of risks, benefits and other options for treatment, the patient has consented to  the endoscopic procedure(s).   The patient's history has been reviewed, patient examined, no change in status, stable for endoscopic procedure(s).  I have reviewed the patient's chart and labs.  Questions were answered to the patient's satisfaction.     Lupita CHARLENA Commander, MD, NOLIA

## 2024-06-11 ENCOUNTER — Telehealth: Payer: Self-pay

## 2024-06-11 NOTE — Telephone Encounter (Signed)
" °  Follow up Call-     06/10/2024    2:51 PM  Call back number  Post procedure Call Back phone  # [phone]  Permission to leave phone message Yes     Patient questions:  Do you have a fever, pain , or abdominal swelling? No. Pain Score  0 *  Have you tolerated food without any problems? Yes.    Have you been able to return to your normal activities? Yes.    Do you have any questions about your discharge instructions: Diet   No. Medications  No. Follow up visit  No.  Do you have questions or concerns about your Care? No.  Actions: * If pain score is 4 or above: No action needed, pain <4.    "

## 2024-06-15 LAB — SURGICAL PATHOLOGY

## 2024-06-23 ENCOUNTER — Ambulatory Visit: Payer: Self-pay | Admitting: Internal Medicine
